# Patient Record
Sex: Female | Born: 1960 | Race: White | Hispanic: No | Smoking: Former smoker
Health system: Southern US, Community
[De-identification: ages and names within clinical notes are randomized; demographics above are authoritative.]

## PROBLEM LIST (undated history)

## (undated) DIAGNOSIS — C50412 Malignant neoplasm of upper-outer quadrant of left female breast: Secondary | ICD-10-CM

## (undated) DIAGNOSIS — F419 Anxiety disorder, unspecified: Secondary | ICD-10-CM

## (undated) DIAGNOSIS — I10 Essential (primary) hypertension: Secondary | ICD-10-CM

## (undated) DIAGNOSIS — J189 Pneumonia, unspecified organism: Secondary | ICD-10-CM

## (undated) DIAGNOSIS — Z923 Personal history of irradiation: Secondary | ICD-10-CM

## (undated) HISTORY — PX: CHOLECYSTECTOMY: SHX55

## (undated) HISTORY — DX: Malignant neoplasm of upper-outer quadrant of left female breast: C50.412

## (undated) HISTORY — PX: UTERINE FIBROID EMBOLIZATION: SHX825

## (undated) HISTORY — PX: TONSILLECTOMY: SUR1361

---

## 1998-01-22 ENCOUNTER — Other Ambulatory Visit: Admission: RE | Admit: 1998-01-22 | Discharge: 1998-01-22 | Payer: Self-pay | Admitting: Obstetrics and Gynecology

## 1999-01-24 ENCOUNTER — Other Ambulatory Visit: Admission: RE | Admit: 1999-01-24 | Discharge: 1999-01-24 | Payer: Self-pay | Admitting: Obstetrics and Gynecology

## 1999-02-11 ENCOUNTER — Other Ambulatory Visit: Admission: RE | Admit: 1999-02-11 | Discharge: 1999-02-11 | Payer: Self-pay | Admitting: *Deleted

## 1999-06-06 ENCOUNTER — Other Ambulatory Visit: Admission: RE | Admit: 1999-06-06 | Discharge: 1999-06-06 | Payer: Self-pay | Admitting: Obstetrics and Gynecology

## 2000-01-31 ENCOUNTER — Other Ambulatory Visit: Admission: RE | Admit: 2000-01-31 | Discharge: 2000-01-31 | Payer: Self-pay | Admitting: Obstetrics and Gynecology

## 2001-03-19 ENCOUNTER — Other Ambulatory Visit: Admission: RE | Admit: 2001-03-19 | Discharge: 2001-03-19 | Payer: Self-pay | Admitting: Obstetrics and Gynecology

## 2001-05-30 ENCOUNTER — Other Ambulatory Visit: Admission: RE | Admit: 2001-05-30 | Discharge: 2001-05-30 | Payer: Self-pay | Admitting: Obstetrics and Gynecology

## 2002-03-25 ENCOUNTER — Other Ambulatory Visit: Admission: RE | Admit: 2002-03-25 | Discharge: 2002-03-25 | Payer: Self-pay | Admitting: Obstetrics and Gynecology

## 2003-04-15 ENCOUNTER — Other Ambulatory Visit: Admission: RE | Admit: 2003-04-15 | Discharge: 2003-04-15 | Payer: Self-pay | Admitting: Obstetrics and Gynecology

## 2004-05-12 ENCOUNTER — Other Ambulatory Visit: Admission: RE | Admit: 2004-05-12 | Discharge: 2004-05-12 | Payer: Self-pay | Admitting: Obstetrics and Gynecology

## 2004-11-28 ENCOUNTER — Other Ambulatory Visit: Admission: RE | Admit: 2004-11-28 | Discharge: 2004-11-28 | Payer: Self-pay | Admitting: Obstetrics and Gynecology

## 2005-08-11 ENCOUNTER — Other Ambulatory Visit: Admission: RE | Admit: 2005-08-11 | Discharge: 2005-08-11 | Payer: Self-pay | Admitting: Obstetrics and Gynecology

## 2006-11-14 ENCOUNTER — Encounter: Admission: RE | Admit: 2006-11-14 | Discharge: 2006-11-14 | Payer: Self-pay | Admitting: Obstetrics and Gynecology

## 2006-11-23 ENCOUNTER — Encounter: Admission: RE | Admit: 2006-11-23 | Discharge: 2006-11-23 | Payer: Self-pay | Admitting: Diagnostic Radiology

## 2007-01-16 ENCOUNTER — Ambulatory Visit (HOSPITAL_COMMUNITY): Admission: RE | Admit: 2007-01-16 | Discharge: 2007-01-16 | Payer: Self-pay | Admitting: Diagnostic Radiology

## 2007-01-17 ENCOUNTER — Observation Stay (HOSPITAL_COMMUNITY): Admission: RE | Admit: 2007-01-17 | Discharge: 2007-01-18 | Payer: Self-pay | Admitting: Diagnostic Radiology

## 2007-01-22 ENCOUNTER — Ambulatory Visit (HOSPITAL_COMMUNITY): Admission: RE | Admit: 2007-01-22 | Discharge: 2007-01-22 | Payer: Self-pay | Admitting: Diagnostic Radiology

## 2007-01-23 ENCOUNTER — Ambulatory Visit (HOSPITAL_COMMUNITY): Admission: RE | Admit: 2007-01-23 | Discharge: 2007-01-23 | Payer: Self-pay | Admitting: Diagnostic Radiology

## 2010-11-21 ENCOUNTER — Encounter: Payer: Self-pay | Admitting: Diagnostic Radiology

## 2012-04-03 ENCOUNTER — Other Ambulatory Visit: Payer: Self-pay | Admitting: Obstetrics and Gynecology

## 2012-04-03 DIAGNOSIS — R928 Other abnormal and inconclusive findings on diagnostic imaging of breast: Secondary | ICD-10-CM

## 2012-04-09 ENCOUNTER — Ambulatory Visit
Admission: RE | Admit: 2012-04-09 | Discharge: 2012-04-09 | Disposition: A | Discharge status: Home / Self Care | Payer: BC Managed Care – PPO | Source: Ambulatory Visit | Attending: Obstetrics and Gynecology | Admitting: Obstetrics and Gynecology

## 2012-04-09 DIAGNOSIS — R928 Other abnormal and inconclusive findings on diagnostic imaging of breast: Secondary | ICD-10-CM

## 2014-07-03 ENCOUNTER — Encounter (HOSPITAL_BASED_OUTPATIENT_CLINIC_OR_DEPARTMENT_OTHER): Payer: Self-pay | Admitting: Emergency Medicine

## 2014-07-03 ENCOUNTER — Emergency Department (HOSPITAL_BASED_OUTPATIENT_CLINIC_OR_DEPARTMENT_OTHER)
Admission: EM | Admit: 2014-07-03 | Discharge: 2014-07-04 | Disposition: A | Discharge status: Other Acute Inpatient Hospital | Payer: BC Managed Care – PPO | Attending: Emergency Medicine | Admitting: Emergency Medicine

## 2014-07-03 ENCOUNTER — Emergency Department (HOSPITAL_BASED_OUTPATIENT_CLINIC_OR_DEPARTMENT_OTHER): Payer: BC Managed Care – PPO

## 2014-07-03 DIAGNOSIS — Z87891 Personal history of nicotine dependence: Secondary | ICD-10-CM | POA: Insufficient documentation

## 2014-07-03 DIAGNOSIS — K819 Cholecystitis, unspecified: Secondary | ICD-10-CM | POA: Diagnosis not present

## 2014-07-03 DIAGNOSIS — R1013 Epigastric pain: Secondary | ICD-10-CM | POA: Insufficient documentation

## 2014-07-03 LAB — URINALYSIS, ROUTINE W REFLEX MICROSCOPIC
BILIRUBIN URINE: NEGATIVE
GLUCOSE, UA: NEGATIVE mg/dL
Hgb urine dipstick: NEGATIVE
KETONES UR: NEGATIVE mg/dL
Leukocytes, UA: NEGATIVE
Nitrite: NEGATIVE
PROTEIN: NEGATIVE mg/dL
SPECIFIC GRAVITY, URINE: 1.008 (ref 1.005–1.030)
UROBILINOGEN UA: 0.2 mg/dL (ref 0.0–1.0)
pH: 7 (ref 5.0–8.0)

## 2014-07-03 LAB — COMPREHENSIVE METABOLIC PANEL WITH GFR
ALT: 16 U/L (ref 0–35)
AST: 16 U/L (ref 0–37)
Albumin: 4.1 g/dL (ref 3.5–5.2)
Alkaline Phosphatase: 119 U/L — ABNORMAL HIGH (ref 39–117)
Anion gap: 14 (ref 5–15)
BUN: 9 mg/dL (ref 6–23)
CO2: 25 meq/L (ref 19–32)
Calcium: 10.2 mg/dL (ref 8.4–10.5)
Chloride: 101 meq/L (ref 96–112)
Creatinine, Ser: 0.7 mg/dL (ref 0.50–1.10)
GFR calc Af Amer: >90 mL/min (ref >90)
GFR calc non Af Amer: >90 mL/min (ref >90)
Glucose, Bld: 113 mg/dL — ABNORMAL HIGH (ref 70–99)
Potassium: 4.7 meq/L (ref 3.7–5.3)
Sodium: 140 meq/L (ref 137–147)
Total Bilirubin: 0.3 mg/dL (ref 0.3–1.2)
Total Protein: 8.1 g/dL (ref 6.0–8.3)

## 2014-07-03 LAB — LIPASE, BLOOD: Lipase: 32 U/L (ref 11–59)

## 2014-07-03 LAB — CBC
HCT: 40.5 % (ref 36.0–46.0)
Hemoglobin: 13.3 g/dL (ref 12.0–15.0)
MCH: 27.7 pg (ref 26.0–34.0)
MCHC: 32.8 g/dL (ref 30.0–36.0)
MCV: 84.2 fL (ref 78.0–100.0)
Platelets: 303 K/uL (ref 150–400)
RBC: 4.81 MIL/uL (ref 3.87–5.11)
RDW: 14.1 % (ref 11.5–15.5)
WBC: 13.8 K/uL — ABNORMAL HIGH (ref 4.0–10.5)

## 2014-07-03 LAB — OCCULT BLOOD X 1 CARD TO LAB, STOOL: Fecal Occult Bld: NEGATIVE

## 2014-07-03 LAB — TROPONIN I: Troponin I: <0.3 ng/mL (ref <0.30)

## 2014-07-03 MED ORDER — PANTOPRAZOLE SODIUM 40 MG IV SOLR
40.0000 mg | Freq: Once | INTRAVENOUS | Status: AC
  Administered 2014-07-03: 40 mg via INTRAVENOUS
  Filled 2014-07-03: qty 40

## 2014-07-03 MED ORDER — FAMOTIDINE IN NACL 20-0.9 MG/50ML-% IV SOLN
20.0000 mg | Freq: Once | INTRAVENOUS | Status: AC
  Administered 2014-07-03: 20 mg via INTRAVENOUS
  Filled 2014-07-03: qty 50

## 2014-07-03 MED ORDER — HYDROMORPHONE HCL PF 1 MG/ML IJ SOLN
1.0000 mg | Freq: Once | INTRAMUSCULAR | Status: AC
  Administered 2014-07-03: 1 mg via INTRAVENOUS
  Filled 2014-07-03: qty 1

## 2014-07-03 MED ORDER — IOHEXOL 300 MG/ML  SOLN
50.0000 mL | Freq: Once | INTRAMUSCULAR | Status: AC | PRN
  Administered 2014-07-03: 50 mL via ORAL

## 2014-07-03 MED ORDER — ONDANSETRON HCL 4 MG/2ML IJ SOLN
4.0000 mg | Freq: Once | INTRAMUSCULAR | Status: AC
  Administered 2014-07-03: 4 mg via INTRAVENOUS
  Filled 2014-07-03: qty 2

## 2014-07-03 MED ORDER — IOHEXOL 300 MG/ML  SOLN
100.0000 mL | Freq: Once | INTRAMUSCULAR | Status: AC | PRN
  Administered 2014-07-03: 100 mL via INTRAVENOUS

## 2014-07-03 NOTE — ED Notes (Signed)
MD at bedside. 

## 2014-07-03 NOTE — ED Notes (Signed)
Pt reports abdominal pain with nausea since Wednesday. Pain located epigastric area, radiating into back. No fever, diarrhea. Decreased appetite.

## 2014-07-03 NOTE — ED Provider Notes (Signed)
CSN: [id number]     Arrival date & time 07/03/14  1752 History  This chart was scribed for No att. providers found by Donato Schultz, ED Scribe. This patient was seen in room MHOTF/OTF and the patient's care was started at 8:17 PM.     Chief Complaint  Patient presents with  . Abdominal Pain    HPI HPI Comments: Norma Robles is a 53 y.o. female who presents to the Emergency Department complaining of gradually worsening, constant epigastric abdominal pain radiating to her upper back that started 48 hours ago with gradual onset.  Advil provided mild relief to her symptoms.  She lists nausea and decreased appetite as associated symptoms.  She denies vomiting, cough, fever, chest pain, dysuria, vaginal discharge, vaginal bleeding, hematochezia, diarrhea, and melena as associated symptoms.  She has never experienced these symptoms in the past.  She is general very healthy.  She does not have a history of abdominal surgeries.  She has a family history of gallbladder problems.     History reviewed. No pertinent past medical history. Past Surgical History  Procedure Laterality Date  . Tonsillectomy     No family history on file. History  Substance Use Topics  . Smoking status: Former Research scientist (life sciences)  . Smokeless tobacco: Not on file  . Alcohol Use: No   OB History   Grav Para Term Preterm Abortions TAB SAB Ect Mult Living                 Review of Systems 10 Systems reviewed and are negative for acute change except as noted in the HPI.   Allergies  Codeine  Home Medications   Prior to Admission medications   Not on File   BP 115/67  Pulse 83  Temp(Src) 98 F (36.7 C) (Oral)  Resp 16  Ht 5\' 4"  (1.626 m)  Wt 147 lb (66.679 kg)  BMI 25.22 kg/m2  SpO2 99% Physical Exam  Nursing note and vitals reviewed. Constitutional:  Awake, alert, nontoxic appearance.  HENT:  Head: Atraumatic.  Eyes: Right eye exhibits no discharge. Left eye exhibits no discharge.  Neck: Neck supple.   Cardiovascular: Normal rate and regular rhythm.   No murmur heard. Pulmonary/Chest: Effort normal and breath sounds normal. No respiratory distress. She has no wheezes. She has no rales. She exhibits no tenderness.  Pulse ox normal.  Abdominal: Soft. There is tenderness. There is no rebound.  Bowel sounds present.  Moderate tenderness to epigastrium and right upper quadrant with mild tenderness to the remainder of the abdomen.    Musculoskeletal: She exhibits no tenderness.  Baseline ROM, no obvious new focal weakness.  Neurological:  Mental status and motor strength appears baseline for patient and situation.  Skin: No rash noted.  Psychiatric: She has a normal mood and affect.    ED Course  Procedures (including critical care time) Pt stable in ED with no significant deterioration in condition.Patient / Family / Caregiver informed of clinical course, understand medical decision-making process, and agree with plan. Pt's PCP is with [facility] at [place], d/w [facility] S[facility] at [place] will consult in AM, [place] [facility] hospitalist paged for transfer. Recheck Pt still with moderate pain and RUQ tenderness. D/w Dr. Maudie Mercury accepts for transfer. E[place] Reviewed  CBC - Abnormal; Notable for the following:    WBC 13.8 (*)    All other components within normal limits  COMPREHENSIVE METABOLIC PANEL - Abnormal; Notable for the following:  Glucose, Bld 113 (*)    Alkaline Phosphatase 119 (*)    All other components within normal limits  URINALYSIS, ROUTINE W REFLEX MICROSCOPIC  LIPASE, BLOOD  TROPONIN I  OCCULT BLOOD X 1 CARD TO LAB, STOOL  POC OCCULT BLOOD, ED    Imaging Review Ct Abdomen Pelvis W Contrast  07/03/2014   CLINICAL DATA:  Epigastric and right upper quadrant abdominal pain. Elevated white blood cell count. History of fibroid embolization.  EXAM: CT ABDOMEN AND PELVIS WITH CONTRAST  TECHNIQUE: Multidetector CT imaging of the abdomen and pelvis was performed using the  standard protocol following bolus administration of intravenous contrast.  CONTRAST:  52mL OMNIPAQUE IOHEXOL 300 MG/ML SOLN, 158mL OMNIPAQUE IOHEXOL 300 MG/ML SOLN  COMPARISON:  MRI of the pelvis on 11/23/2006.  FINDINGS: There are several calcified gallstones in a distended gallbladder. Inflammatory changes are seen around the gallbladder and findings are suggestive of cholecystitis. No associated biliary ductal dilatation. The liver shows a small cyst adjacent to the gallbladder fossa measuring 10 mm.  The pancreas, spleen, adrenal glands and kidneys are within normal limits. Degenerated fibroids are present with overall decreased uterine volume compared to prior MRI with changes reflecting post embolization changes. The bladder is unremarkable. No abnormal fluid collections. Bowel loops are within normal limits. No hernias. No masses or enlarged lymph nodes. Bony structures are unremarkable.  IMPRESSION: Cholelithiasis. The gallbladder is distended and inflammatory changes are seen around the gallbladder suggestive of acute cholecystitis. No focal abscess is identified.   Electronically Signed   By: Aletta Edouard M.D.   On: 07/03/2014 22:06     EKG Interpretation None      MDM   Final diagnoses:  Cholecystitis    Surg recs no antibiotics for now.  The patient appears reasonably stabilized for transfer considering the current resources, flow, and capabilities available in the ED at this time, and I doubt any other [facility] requiring further screening and/or treatment in the ED prior to transfer.  I personally performed the services described in this documentation, which was scribed in my presence. The recorded information has been reviewed and is accurate.    Babette Relic, MD 07/04/14 [phone]

## 2014-07-03 NOTE — ED Notes (Signed)
Family at bedside. 

## 2014-07-03 NOTE — ED Notes (Signed)
Patient transported to CT 

## 2014-07-04 DIAGNOSIS — Z87891 Personal history of nicotine dependence: Secondary | ICD-10-CM | POA: Diagnosis not present

## 2014-07-04 DIAGNOSIS — K819 Cholecystitis, unspecified: Secondary | ICD-10-CM | POA: Diagnosis not present

## 2014-07-04 DIAGNOSIS — R1013 Epigastric pain: Secondary | ICD-10-CM | POA: Diagnosis present

## 2014-10-30 DIAGNOSIS — C50919 Malignant neoplasm of unspecified site of unspecified female breast: Secondary | ICD-10-CM

## 2014-10-30 DIAGNOSIS — Z923 Personal history of irradiation: Secondary | ICD-10-CM

## 2014-10-30 HISTORY — DX: Malignant neoplasm of unspecified site of unspecified female breast: C50.919

## 2014-10-30 HISTORY — PX: CERVICAL POLYPECTOMY: SHX88

## 2014-10-30 HISTORY — DX: Personal history of irradiation: Z92.3

## 2015-02-10 ENCOUNTER — Ambulatory Visit (INDEPENDENT_AMBULATORY_CARE_PROVIDER_SITE_OTHER): Payer: BLUE CROSS/BLUE SHIELD | Admitting: Podiatry

## 2015-02-10 ENCOUNTER — Encounter: Payer: Self-pay | Admitting: Podiatry

## 2015-02-10 VITALS — BP 146/75 | HR 70 | Ht 60.0 in | Wt 154.0 lb

## 2015-02-10 DIAGNOSIS — M7662 Achilles tendinitis, left leg: Secondary | ICD-10-CM | POA: Diagnosis not present

## 2015-02-10 DIAGNOSIS — M7661 Achilles tendinitis, right leg: Secondary | ICD-10-CM | POA: Diagnosis not present

## 2015-02-10 DIAGNOSIS — M216X9 Other acquired deformities of unspecified foot: Secondary | ICD-10-CM | POA: Diagnosis not present

## 2015-02-10 MED ORDER — DICLOFENAC SODIUM 1 % TD GEL: 2.0000 g | Freq: Three times a day (TID) | TRANSDERMAL | Status: DC

## 2015-02-10 NOTE — Patient Instructions (Signed)
Seen for pain on back of both heels.  Reviewed findings. Need to do stretch exercise, wear sandal with heel at home, and use Night splint each night. Return for custom orthotics.

## 2015-02-10 NOTE — Progress Notes (Signed)
Subjective: 54 year old female presents complaining of heel pain over 6 months. Patient points back of Achilles tendon insertion site being the source of pain. Used Dr. Felicie Morn pad and exercised without much success. Pain has been off and on. Now pain is staying and hurts daily.  Sits on desk at work. Work place has concrete and house is on slab. Wears different styles of shoes.   Review of systems: Negative.  Objective:  Neurovascular status are within normal.  Pain at posterior heel at tendon insertion site. No visible edema or erythema noted. Cavus type foot bilateral. Tight Achilles tendon bilateral.  Assessment: Achilles tendonitis bilateral. Equinus bilateral.   Plan:  Reviewed findings and available treatment options. Night splint dispensed x 2. Voltaren gel Rx sent. Need to do stretch exercise.  Avoid flat shoes and keep house shoe with heel lift.

## 2015-02-19 ENCOUNTER — Ambulatory Visit (INDEPENDENT_AMBULATORY_CARE_PROVIDER_SITE_OTHER): Payer: BLUE CROSS/BLUE SHIELD | Admitting: Podiatry

## 2015-02-19 ENCOUNTER — Encounter: Payer: Self-pay | Admitting: Podiatry

## 2015-02-19 DIAGNOSIS — M7660 Achilles tendinitis, unspecified leg: Secondary | ICD-10-CM

## 2015-02-19 DIAGNOSIS — M7661 Achilles tendinitis, right leg: Secondary | ICD-10-CM

## 2015-02-19 DIAGNOSIS — M216X9 Other acquired deformities of unspecified foot: Secondary | ICD-10-CM | POA: Diagnosis not present

## 2015-02-19 DIAGNOSIS — M7662 Achilles tendinitis, left leg: Principal | ICD-10-CM

## 2015-02-19 DIAGNOSIS — M79673 Pain in unspecified foot: Secondary | ICD-10-CM | POA: Diagnosis not present

## 2015-02-19 NOTE — Patient Instructions (Signed)
Casted for orthotics to fit in dress shoes.

## 2015-02-19 NOTE — Progress Notes (Signed)
Subjective: 54 year old female presents to prepare for custom orthotics. She usually wears dress shoes and wants the orthotics to be fit for them.  She is using night splints that are helping.   Objective:  Neurovascular status are within normal.  Pain at posterior heel at tendon insertion site. No visible edema or erythema noted. Cavus type foot bilateral. Tight Achilles tendon bilateral. 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 No acute osseous or articulation changes noted.   Assessment: Achilles tendonitis bilateral. Equinus bilateral.   Plan:  Reviewed findings and available treatment options. Both feet casted for orthotics.

## 2015-04-23 ENCOUNTER — Ambulatory Visit: Payer: BLUE CROSS/BLUE SHIELD | Admitting: Podiatry

## 2015-05-27 ENCOUNTER — Other Ambulatory Visit: Payer: Self-pay | Admitting: Obstetrics and Gynecology

## 2015-05-27 DIAGNOSIS — R928 Other abnormal and inconclusive findings on diagnostic imaging of breast: Secondary | ICD-10-CM

## 2015-06-02 ENCOUNTER — Ambulatory Visit
Admission: RE | Admit: 2015-06-02 | Discharge: 2015-06-02 | Disposition: A | Discharge status: Home / Self Care | Payer: BLUE CROSS/BLUE SHIELD | Source: Ambulatory Visit | Attending: Obstetrics and Gynecology | Admitting: Obstetrics and Gynecology

## 2015-06-02 ENCOUNTER — Other Ambulatory Visit: Payer: Self-pay | Admitting: Obstetrics and Gynecology

## 2015-06-02 DIAGNOSIS — R928 Other abnormal and inconclusive findings on diagnostic imaging of breast: Secondary | ICD-10-CM

## 2015-06-02 DIAGNOSIS — N6320 Unspecified lump in the left breast, unspecified quadrant: Secondary | ICD-10-CM

## 2015-06-02 HISTORY — PX: BREAST BIOPSY: SHX20

## 2015-06-04 ENCOUNTER — Telehealth: Payer: Self-pay | Admitting: *Deleted

## 2015-06-04 ENCOUNTER — Encounter: Payer: Self-pay | Admitting: *Deleted

## 2015-06-04 DIAGNOSIS — C50412 Malignant neoplasm of upper-outer quadrant of left female breast: Secondary | ICD-10-CM

## 2015-06-04 HISTORY — DX: Malignant neoplasm of upper-outer quadrant of left female breast: C50.412

## 2015-06-04 NOTE — Telephone Encounter (Signed)
Confirmed [facility] for 06/09/15 at 1200 .  Instructions and contact information given.

## 2015-06-09 ENCOUNTER — Encounter: Payer: Self-pay | Admitting: Adult Health

## 2015-06-09 ENCOUNTER — Encounter: Payer: Self-pay | Admitting: General Practice

## 2015-06-09 ENCOUNTER — Encounter: Payer: Self-pay | Admitting: Hematology and Oncology

## 2015-06-09 ENCOUNTER — Encounter: Payer: Self-pay | Admitting: Physical Therapy

## 2015-06-09 ENCOUNTER — Ambulatory Visit (HOSPITAL_BASED_OUTPATIENT_CLINIC_OR_DEPARTMENT_OTHER): Payer: BLUE CROSS/BLUE SHIELD | Admitting: Hematology and Oncology

## 2015-06-09 ENCOUNTER — Ambulatory Visit: Payer: BLUE CROSS/BLUE SHIELD

## 2015-06-09 ENCOUNTER — Other Ambulatory Visit: Payer: Self-pay | Admitting: General Surgery

## 2015-06-09 ENCOUNTER — Ambulatory Visit: Payer: BLUE CROSS/BLUE SHIELD | Attending: General Surgery | Admitting: Physical Therapy

## 2015-06-09 ENCOUNTER — Ambulatory Visit
Admission: RE | Admit: 2015-06-09 | Discharge: 2015-06-09 | Disposition: A | Discharge status: Home / Self Care | Payer: BLUE CROSS/BLUE SHIELD | Source: Ambulatory Visit | Attending: Radiation Oncology | Admitting: Radiation Oncology

## 2015-06-09 ENCOUNTER — Other Ambulatory Visit (HOSPITAL_BASED_OUTPATIENT_CLINIC_OR_DEPARTMENT_OTHER): Payer: BLUE CROSS/BLUE SHIELD

## 2015-06-09 VITALS — BP 135/64 | HR 77 | Temp 98.1°F | Resp 18 | Ht 60.0 in | Wt 149.1 lb

## 2015-06-09 DIAGNOSIS — C50912 Malignant neoplasm of unspecified site of left female breast: Secondary | ICD-10-CM

## 2015-06-09 DIAGNOSIS — Z170 Estrogen receptor positive status [ER+]: Secondary | ICD-10-CM

## 2015-06-09 DIAGNOSIS — C50412 Malignant neoplasm of upper-outer quadrant of left female breast: Secondary | ICD-10-CM | POA: Insufficient documentation

## 2015-06-09 DIAGNOSIS — R293 Abnormal posture: Secondary | ICD-10-CM | POA: Diagnosis present

## 2015-06-09 LAB — CBC WITH DIFFERENTIAL/PLATELET
BASO%: 1 % (ref 0.0–2.0)
BASOS ABS: 0.1 10*3/uL (ref 0.0–0.1)
EOS%: 1.6 % (ref 0.0–7.0)
Eosinophils Absolute: 0.1 10*3/uL (ref 0.0–0.5)
HEMATOCRIT: 35.1 % (ref 34.8–46.6)
HEMOGLOBIN: 11.2 g/dL — AB (ref 11.6–15.9)
LYMPH%: 27.8 % (ref 14.0–49.7)
MCH: 23.9 pg — ABNORMAL LOW (ref 25.1–34.0)
MCHC: 32 g/dL (ref 31.5–36.0)
MCV: 74.5 fL — AB (ref 79.5–101.0)
MONO#: 0.5 10*3/uL (ref 0.1–0.9)
MONO%: 6.2 % (ref 0.0–14.0)
NEUT#: 5.1 10*3/uL (ref 1.5–6.5)
NEUT%: 63.4 % (ref 38.4–76.8)
Platelets: 334 10*3/uL (ref 145–400)
RBC: 4.7 10*6/uL (ref 3.70–5.45)
RDW: 15.8 % — ABNORMAL HIGH (ref 11.2–14.5)
WBC: 8 10*3/uL (ref 3.9–10.3)
lymph#: 2.2 10*3/uL (ref 0.9–3.3)

## 2015-06-09 LAB — COMPREHENSIVE METABOLIC PANEL (CC13)
ALK PHOS: 127 U/L (ref 40–150)
ALT: 23 U/L (ref 0–55)
ANION GAP: 9 meq/L (ref 3–11)
AST: 17 U/L (ref 5–34)
Albumin: 4.4 g/dL (ref 3.5–5.0)
BILIRUBIN TOTAL: 0.28 mg/dL (ref 0.20–1.20)
BUN: 12.9 mg/dL (ref 7.0–26.0)
CALCIUM: 10.3 mg/dL (ref 8.4–10.4)
CO2: 27 mEq/L (ref 22–29)
Chloride: 107 mEq/L (ref 98–109)
Creatinine: 0.8 mg/dL (ref 0.6–1.1)
EGFR: 84 mL/min/{1.73_m2} — ABNORMAL LOW (ref >90)
Glucose: 97 mg/dl (ref 70–140)
POTASSIUM: 4.3 meq/L (ref 3.5–5.1)
SODIUM: 142 meq/L (ref 136–145)
Total Protein: 7.6 g/dL (ref 6.4–8.3)

## 2015-06-09 NOTE — Therapy (Signed)
[place], [place], [address] Phone: [phone]   Fax:  [phone]  Physical Therapy Evaluation  Patient Details  Name: Norma Robles MRN: [id number] Date of Birth: [birth year] Referring Provider:  Excell Seltzer, MD  Encounter Date: 06/09/2015      PT End of Session - 06/09/15 1518    Visit Number 1   Number of Visits 1   PT Start Time 3244   PT Stop Time 0102  Also saw pt from 1406-1419   PT Time Calculation (min) 17 min   Activity Tolerance Patient tolerated treatment well   Behavior During Therapy [facility] for tasks assessed/performed      Past Medical History  Diagnosis Date  . Breast cancer of upper-outer quadrant of left female breast 06/04/2015    Past Surgical History  Procedure Laterality Date  . Tonsillectomy    . Cholecystectomy    . Uterine fibroid embolization      There were no vitals filed for this visit.  Visit Diagnosis:  Carcinoma of upper-outer quadrant of left female breast - Plan: PT plan of care cert/re-cert  Abnormal posture - Plan: PT plan of care cert/re-cert      Subjective Assessment - 06/09/15 1524    Pertinent History Patient was diagnosed on 05/24/15 with left breast cancer.  It is ER/PR positive and HER2 negative.  It is located in the upper outer quadrant, measures 1.6 cm, with a Ki67 of 30%.            [facility] PT Assessment - 06/09/15 0001    Assessment   Medical Diagnosis Left breast cancer   Onset Date/Surgical Date 05/24/15   Hand Dominance Right   Prior Therapy none   Precautions   Precautions Other (comment)  Active breast cancer   Restrictions   Weight Bearing Restrictions No   Balance Screen   Has the patient fallen in the past 6 months No   Has the patient had a decrease in activity level because of a fear of falling?  No   Is the patient reluctant to leave their home because of a fear of falling?  No   Home Chiropractor Private residence   Living Arrangements Alone   Available Help at Discharge Family   Prior Function   Level of Independence Independent   Vocation Full time employment   Engineer, site on computer full time   Leisure She does not exercise   Cognition   Overall Cognitive Status Within Functional Limits for tasks assessed   Posture/Postural Control   Posture/Postural Control Postural limitations   Postural Limitations Rounded Shoulders   ROM / Strength   AROM / PROM / Strength AROM;Strength   AROM   AROM Assessment Site Shoulder   Right/Left Shoulder Right;Left   Right Shoulder Extension 56 Degrees   Right Shoulder Flexion 153 Degrees   Right Shoulder ABduction 160 Degrees   Right Shoulder Internal Rotation 54 Degrees   Right Shoulder External Rotation 79 Degrees   Left Shoulder Extension 54 Degrees   Left Shoulder Flexion 159 Degrees   Left Shoulder ABduction 164 Degrees   Left Shoulder Internal Rotation 67 Degrees   Left Shoulder External Rotation 74 Degrees   Strength   Overall Strength Within functional limits for tasks performed           LYMPHEDEMA/ONCOLOGY QUESTIONNAIRE - 06/09/15 1439    Type   Cancer Type Left breast cancer   Lymphedema  Assessments   Lymphedema Assessments Upper extremities   Right Upper Extremity Lymphedema   10 cm Proximal to Olecranon Process 26.9 cm   Olecranon Process 23 cm   10 cm Proximal to Ulnar Styloid Process 22.8 cm   Just Proximal to Ulnar Styloid Process 16.1 cm   Across Hand at [employer] 18.6 cm   At [place] of 2nd Digit 6.1 cm   Left Upper Extremity Lymphedema   10 cm Proximal to Olecranon Process 26 cm   Olecranon Process 23.5 cm   10 cm Proximal to Ulnar Styloid Process 22.3 cm   Just Proximal to Ulnar Styloid Process 15.2 cm   Across Hand at [employer] 17.6 cm   At [place] of 2nd Digit 6 cm      Patient was instructed today in a home exercise program today for post op  shoulder range of motion. These included active assist shoulder flexion in sitting, scapular retraction, wall walking with shoulder abduction, and hands behind head external rotation.  She was encouraged to do these twice a day, holding 3 seconds and repeating 5 times when permitted by her physician.         PT Education - 06/09/15 1518    Education provided Yes   Education Details Post op shoulder ROM HEP and lymphedema risk reduction   Person(s) Educated Patient;Child(ren)   Methods Explanation;Demonstration;Handout   Comprehension Verbalized understanding;Returned demonstration              Breast Clinic Goals - 06/09/15 1524    Patient will be able to verbalize understanding of pertinent lymphedema risk reduction practices relevant to her diagnosis specifically related to skin care.   Time 1   Period Days   Status Achieved   Patient will be able to return demonstrate and/or verbalize understanding of the post-op home exercise program related to regaining shoulder range of motion.   Time 1   Period Days   Status Achieved   Patient will be able to verbalize understanding of the importance of attending the postoperative After Breast Cancer Class for further lymphedema risk reduction education and therapeutic exercise.   Time 1   Period Days   Status Achieved              Plan - 06/09/15 1521    Clinical Impression Statement Patient was diagnosed on 05/24/15 with left breast cancer.  It is ER/PR positive and HER2 negative.  It is located in the upper outer quadrant, measures 1.6 cm, with a Ki67 of 30%.  She is planning to have a left lumpectomy and sentinel node biopsy followed by Oncotype testing and radiation and anti-estrogen therapy.  She may benefit from post op PT to regain shoulder ROM and strength and reduce her lymphedema risk.   Pt will benefit from skilled therapeutic intervention in order to improve on the following deficits Decreased range of motion;Impaired  UE functional use;Pain;Decreased knowledge of precautions;Decreased strength   Rehab Potential Excellent   Clinical Impairments Affecting Rehab Potential none   PT Frequency One time visit   PT Treatment/Interventions Patient/family education;Therapeutic exercise   Consulted and Agree with Plan of Care Patient;Family member/caregiver   Family Member Consulted daughters     Patient will follow up at outpatient cancer rehab if needed following surgery.  If the patient requires physical therapy at that time, a specific plan will be dictated and sent to the referring physician for approval. The patient was educated today on appropriate basic range of motion  exercises to begin post operatively and the importance of attending the After Breast Cancer class following surgery.  Patient was educated today on lymphedema risk reduction practices as it pertains to recommendations that will benefit the patient immediately following surgery.  She verbalized good understanding.  No additional physical therapy is indicated at this time.       Problem List Patient Active Problem List   Diagnosis Date Noted  . Breast cancer of upper-outer quadrant of left female breast 06/04/2015  . Achilles tendonitis, bilateral 02/10/2015  . Equinus deformity of foot, acquired 02/10/2015   Annia Friendly, PT 06/09/2015 3:26 PM  [place], [place], [address] Phone: [phone]   Fax:  [phone]

## 2015-06-09 NOTE — Progress Notes (Signed)
Ms. Hunsberger is a very pleasant 54 y.o. 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  Biopsy results revealed the tumor's prognostic profile is ER positive and PR positive. HER2/neu is pending.   She presents today with her 2 daughters to the [facility]) for treatment consideration and recommendations from the breast surgeon, radiation oncologist, and medical oncologist.     I briefly met with Ms. Steedman and her daughters during her [facility] visit today. We discussed the purpose of the Survivorship Clinic, which will include monitoring for recurrence, coordinating completion of age and gender-appropriate cancer screenings, promotion of overall wellness, as well as managing potential late/long-term side effects of anti-cancer treatments.    The treatment plan for Ms. Connors will likely include surgery, radiation therapy, and anti-estrogen therapy.  As of today, the intent of treatment for Ms. Coldren is cure, therefore she will be eligible for the Survivorship Clinic upon her completion of treatment.  Her survivorship care plan (SCP) document will be drafted and updated throughout the course of her treatment trajectory. She will receive the SCP in an office visit with our breast survivorship NP in the Survivorship Clinic once she has completed treatment.   Ms. Scarbrough was encouraged to ask questions and all questions were answered to her satisfaction.  She was given my business card and encouraged to contact me with any concerns regarding survivorship.  We look forward to participating in her care.   Mike Craze, NP [place] [phone]

## 2015-06-09 NOTE — Patient Instructions (Signed)

## 2015-06-09 NOTE — Progress Notes (Signed)
[facility] NOTE  Patient Care Team: Rafaela Arville Care, MD as PCP - General (Family Medicine) Molli Posey, MD as Consulting Physician (Obstetrics and Gynecology) Excell Seltzer, MD as Consulting Physician (General Surgery) Nicholas Lose, MD as Consulting Physician (Hematology and Oncology) Eppie Gibson, MD as Attending Physician (Radiation Oncology) Mauro Kaufmann, RN as Registered Nurse Rockwell Germany, RN as Registered Nurse Holley Bouche, NP as Nurse Practitioner (Nurse Practitioner) Sylvan Cheese, NP as Nurse Practitioner (Nurse Practitioner)  CHIEF COMPLAINTS/PURPOSE OF CONSULTATION:  Newly diagnosed breast cancer  HISTORY OF PRESENTING ILLNESS:  Norma Robles 54 y.o. female is here because of recent diagnosis of left breast cancer. She had a screening mammogram which revealed left breast asymmetry that led to further investigation with an ultrasound. There were 3 lesions identified by ultrasound. One was a complex cyst at 11:00 position. At 6:00 position an additional area which was felt to be stable. 11:30 position in upper-outer quadrant of the left breast by ultrasound at 1.6 x 0.9 x 1.4 cm lesion was identified. 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 She does not have any complaints of pain and discomfort skin changes nipple discharge or any other issues with the breasts.   I reviewed her records extensively and collaborated the history with the patient.  SUMMARY OF ONCOLOGIC HISTORY:   Breast cancer of upper-outer quadrant of left female breast   06/02/2015 Initial Diagnosis Left breast biopsy 1:30 position: Invasive ductal carcinoma with associated microcalcification, grade 1-2 ER 100%, PR 60%, HER-2 negative, Ki-67 30%, 1.6 cm tumor T1 cN0 M0 stage IA clinical stage    In terms of breast cancer risk profile:  She menarched  at early age of 44 and went to menopause at age 9 but most recently she had a period  She had 2 pregnancies, her first child was born at [facility] received birth control pills for approx years She was never exposed to fertility medications or hormone replacement therapy.  She has no family history of Breast/GYN/GI cancer  MEDICAL HISTORY:  Past Medical History  Diagnosis Date  . Breast cancer of upper-outer quadrant of left female breast 06/04/2015    SURGICAL HISTORY: Past Surgical History  Procedure Laterality Date  . Tonsillectomy    . Cholecystectomy    . Uterine fibroid embolization      SOCIAL HISTORY: Social History   Social History  . Marital Status: Single    Spouse Name: N/A  . Number of Children: N/A  . Years of Education: N/A   Occupational History  . Not on file.   Social History Main Topics  . Smoking status: Never Smoker   . Smokeless tobacco: Never Used  . Alcohol Use: 0.0 oz/week    0 Standard drinks or equivalent per week     Comment: occas  . Drug Use: No  . Sexual Activity: Not on file   Other Topics Concern  . Not on file   Social History Narrative    FAMILY HISTORY: Family History  Problem Relation Age of Onset  . Uterine cancer Mother   . Breast cancer Maternal Aunt     ALLERGIES:  is allergic to codeine.  MEDICATIONS:  Current Outpatient Prescriptions  Medication Sig Dispense Refill  . hydrochlorothiazide (MICROZIDE) 12.5 MG capsule as needed.   0  . Sennosides (EQ LAXATIVE PO) Take by mouth  as needed.    Marland Kitchen buPROPion (WELLBUTRIN XL) 150 MG 24 hr tablet Take 150 mg by mouth.     No current facility-administered medications for this visit.    REVIEW OF SYSTEMS:   Constitutional: Denies fevers, chills or abnormal night sweats Eyes: Denies blurriness of vision, double vision or watery eyes Ears, nose, mouth, throat, and face: Denies mucositis or sore throat Respiratory: Denies cough, dyspnea or wheezes Cardiovascular:  Denies palpitation, chest discomfort or lower extremity swelling Gastrointestinal:  Denies nausea, heartburn or change in bowel habits Skin: Denies abnormal skin rashes Lymphatics: Denies new lymphadenopathy or easy bruising Neurological:Denies numbness, tingling or new weaknesses Behavioral/Psych: Mood is stable, no new changes  Breast:  Denies any palpable lumps or discharge All other systems were reviewed with the patient and are negative.  PHYSICAL EXAMINATION: ECOG PERFORMANCE STATUS: 1 - Symptomatic but completely ambulatory  Filed Vitals:   06/09/15 1238  BP: 135/64  Pulse: 77  Temp: 98.1 F (36.7 C)  Resp: 18   Filed Weights   06/09/15 1238  Weight: 149 lb 1.6 oz (67.631 kg)    GENERAL:alert, no distress and comfortable SKIN: skin color, texture, turgor are normal, no rashes or significant lesions EYES: normal, conjunctiva are pink and non-injected, sclera clear OROPHARYNX:no exudate, no erythema and lips, buccal mucosa, and tongue normal  NECK: supple, thyroid normal size, non-tender, without nodularity LYMPH:  no palpable lymphadenopathy in the cervical, axillary or inguinal LUNGS: clear to auscultation and percussion with normal breathing effort HEART: regular rate & rhythm and no murmurs and no lower extremity edema ABDOMEN:abdomen soft, non-tender and normal bowel sounds Musculoskeletal:no cyanosis of digits and no clubbing  PSYCH: alert & oriented x 3 with fluent speech NEURO: no focal motor/sensory deficits BREAST: No palpable nodules in breast. No palpable axillary or supraclavicular lymphadenopathy (exam performed in the presence of a chaperone)   LABORATORY DATA:  I have reviewed the data as listed Lab Results  Component Value Date   WBC 8.0 06/09/2015   HGB 11.2* 06/09/2015   HCT 35.1 06/09/2015   MCV 74.5* 06/09/2015   PLT 334 06/09/2015   Lab Results  Component Value Date   NA 142 06/09/2015   K 4.3 06/09/2015   CL 101 07/03/2014   CO2 27  06/09/2015    RADIOGRAPHIC STUDIES: I have personally reviewed the radiological reports and agreed with the findings in the report. Results are summarized as above  ASSESSMENT AND PLAN:  Breast cancer of upper-outer quadrant of left female breast Right breast biopsy 06/02/2015: Invasive ductal carcinoma, grade 2, ER 100%, PR 0%, HER-2 negative, Ki-67 10%; by ultrasound measures 1.6 cm T1 cN0 M0 stage IA clinical stage  Pathology and radiology counseling:Discussed with the patient, the details of pathology including the type of breast cancer,the clinical staging, the significance of ER, PR and HER-2/neu receptors and the implications for treatment. After reviewing the pathology in detail, we proceeded to discuss the different treatment options between surgery, radiation, chemotherapy, antiestrogen therapies.  Recommendations: 1. Breast conserving surgery followed by 2. Oncotype DX testing to determine if chemotherapy would be of any benefit followed by 3. Adjuvant radiation therapy followed by 4. Adjuvant antiestrogen therapy  Oncotype counseling: I discussed Oncotype DX test. I explained to the patient that this is a 21 gene panel to evaluate patient tumors DNA to calculate recurrence score. This would help determine whether patient has high risk or intermediate risk or low risk breast cancer. She understands that if her tumor was found  to be high risk, she would benefit from systemic chemotherapy. If low risk, no need of chemotherapy. If she was found to be intermediate risk, we would need to evaluate the score as well as other risk factors and determine if an abbreviated chemotherapy may be of benefit.  Return to clinic after surgery to discuss final pathology report and then determine if Oncotype DX testing will need to be sent.     All questions were answered. The patient knows to call the clinic with any problems, questions or concerns.    Rulon Eisenmenger, MD 3:54 PM

## 2015-06-09 NOTE — Assessment & Plan Note (Signed)
Right breast biopsy 06/02/2015: Invasive ductal carcinoma, grade 2, ER 100%, PR 0%, HER-2 negative, Ki-67 10%; by ultrasound measures 1.6 cm T1 cN0 M0 stage IA clinical stage  Pathology and radiology counseling:Discussed with the patient, the details of pathology including the type of breast cancer,the clinical staging, the significance of ER, PR and HER-2/neu receptors and the implications for treatment. After reviewing the pathology in detail, we proceeded to discuss the different treatment options between surgery, radiation, chemotherapy, antiestrogen therapies.  Recommendations: 1. Breast conserving surgery followed by 2. Oncotype DX testing to determine if chemotherapy would be of any benefit followed by 3. Adjuvant radiation therapy followed by 4. Adjuvant antiestrogen therapy  Oncotype counseling: I discussed Oncotype DX test. I explained to the patient that this is a 21 gene panel to evaluate patient tumors DNA to calculate recurrence score. This would help determine whether patient has high risk or intermediate risk or low risk breast cancer. She understands that if her tumor was found to be high risk, she would benefit from systemic chemotherapy. If low risk, no need of chemotherapy. If she was found to be intermediate risk, we would need to evaluate the score as well as other risk factors and determine if an abbreviated chemotherapy may be of benefit.  Return to clinic after surgery to discuss final pathology report and then determine if Oncotype DX testing will need to be sent.

## 2015-06-09 NOTE — Progress Notes (Signed)
Checked in new pt with no financial concerns prior to seeing the dr.  Informed pt if chemo is part of her treatment we will contact her ins to see if Josem Kaufmann is required and will obtain it if it is as well as contact foundations that offer copay assistance if needed.  She has my card for any billing questions or concerns.

## 2015-06-09 NOTE — Progress Notes (Signed)
Radiation Oncology         [phone] ________________________________  Initial Outpatient Consultation  Name: Norma Robles MRN: [id number]  Date: 06/09/2015  DOB: 1961/04/23  UL:AGTXMI,WOEHOZY M., MD  Excell Seltzer, MD   REFERRING PHYSICIAN: Excell Seltzer, MD  DIAGNOSIS:    ICD-9-CM ICD-10-CM   1. 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   HISTORY OF PRESENT ILLNESS:Norma Robles is a 54 y.o. female who presented with possible left breast asymmetries on screening mammogram; therefore, on 06/02/2015 she underwent left breast diagnostic mammogram with tomosynthesis. On mammogram this showed three areas of asymmetry in the left breast. Ultrasound was performed. Ultrasound showed a 1.6cm hypoechoic mass at the 1:30 o'clock position. Ultrasound also showed a 1.0cm mass at 11:00 o'clock, favored to be a complicated cyst.  An area at 6:00 was stable. The left axilla was unremarkable. Biopsy on 06/02/2015 of the 1:30 o'clock left breast mass revealied invasive ductal carcinoma with characteristics as above in the diagnosis.  She will undergo D+C and repeat pap for a bleeding uterine polyp and prior abnormal pap smear on Sept 8th. Otherwise she is in her USOH  PREVIOUS RADIATION THERAPY: No  PAST MEDICAL HISTORY:  has a past medical history of Breast cancer of upper-outer quadrant of left female breast (06/04/2015).    PAST SURGICAL HISTORY: Past Surgical History  Procedure Laterality Date  . Tonsillectomy    . Cholecystectomy    . Uterine fibroid embolization      FAMILY HISTORY: family history includes Breast cancer in her maternal aunt; Uterine cancer in her mother.  SOCIAL HISTORY:  reports that she has never smoked. She has never used smokeless tobacco. She reports  that she drinks alcohol. She reports that she does not use illicit drugs.  ALLERGIES: Codeine  MEDICATIONS:  Current Outpatient Prescriptions  Medication Sig Dispense Refill  . buPROPion (WELLBUTRIN XL) 150 MG 24 hr tablet Take 150 mg by mouth.    . hydrochlorothiazide (MICROZIDE) 12.5 MG capsule as needed.   0  . Sennosides (EQ LAXATIVE PO) Take by mouth as needed.     No current facility-administered medications for this encounter.    REVIEW OF SYSTEMS:  Notable for that above.   PHYSICAL EXAM:   Vitals with Age-Percentiles 06/09/2015  Length 248.2 cm  Systolic 500  Diastolic 64  Pulse 77  Respiration 18  Weight 67.631 kg  BMI 29.2  VISIT REPORT    General: Alert and oriented, in no acute distress HEENT: Head is normocephalic. Extraocular movements are intact. Oropharynx is clear. Neck: Neck is supple, no palpable cervical or supraclavicular lymphadenopathy. Heart: Regular in rate and rhythm with no murmurs, rubs, or gallops. Chest: Clear to auscultation bilaterally, with no rhonchi, wheezes, or rales. Abdomen: Soft, nontender, nondistended, with no rigidity or guarding. Extremities: No cyanosis or edema. Lymphatics: see Neck Exam Skin: No concerning lesions. Musculoskeletal: symmetric strength and muscle tone throughout. Neurologic: Cranial nerves II through XII are grossly intact. No obvious focalities. Speech is fluent. Coordination is intact. Psychiatric: Judgment and insight are intact. Affect is appropriate. Breasts: in the 1:30 region of the left breast there is a 2.5 cm palpable mass. Otherwise, no right breast masses nor any axillary masses b/l   ECOG = 0  0 -  Asymptomatic (Fully active, able to carry on all predisease activities without restriction)  1 - Symptomatic but completely ambulatory (Restricted in physically strenuous activity but ambulatory and able to carry out work of a light or sedentary nature. For example, light housework, office work)  2 -  Symptomatic, <50% in bed during the day (Ambulatory and capable of all self care but unable to carry out any work activities. Up and about more than 50% of waking hours)  3 - Symptomatic, >50% in bed, but not bedbound (Capable of only limited self-care, confined to bed or chair 50% or more of waking hours)  4 - Bedbound (Completely disabled. Cannot carry on any self-care. Totally confined to bed or chair)  5 - Death   Eustace Pen MM, Creech RH, Tormey DC, et al. [phone]). "Toxicity and response criteria of the [facility]". [place] Oncol. 5 (6): 649-55   LABORATORY DATA:  Lab Results  Component Value Date   WBC 8.0 06/09/2015   HGB 11.2* 06/09/2015   HCT 35.1 06/09/2015   MCV 74.5* 06/09/2015   PLT 334 06/09/2015   CMP     Component Value Date/Time   NA 142 06/09/2015 1222   NA 140 07/03/2014 2017   K 4.3 06/09/2015 1222   K 4.7 07/03/2014 2017   CL 101 07/03/2014 2017   CO2 27 06/09/2015 1222   CO2 25 07/03/2014 2017   GLUCOSE 97 06/09/2015 1222   GLUCOSE 113* 07/03/2014 2017   BUN 12.9 06/09/2015 1222   BUN 9 07/03/2014 2017   CREATININE 0.8 06/09/2015 1222   CREATININE 0.70 07/03/2014 2017   CALCIUM 10.3 06/09/2015 1222   CALCIUM 10.2 07/03/2014 2017   PROT 7.6 06/09/2015 1222   PROT 8.1 07/03/2014 2017   ALBUMIN 4.4 06/09/2015 1222   ALBUMIN 4.1 07/03/2014 2017   AST 17 06/09/2015 1222   AST 16 07/03/2014 2017   ALT 23 06/09/2015 1222   ALT 16 07/03/2014 2017   ALKPHOS 127 06/09/2015 1222   ALKPHOS 119* 07/03/2014 2017   BILITOT 0.28 06/09/2015 1222   BILITOT 0.3 07/03/2014 2017   GFRNONAA >90 07/03/2014 2017   GFRAA >90 07/03/2014 2017         RADIOGRAPHY: US Aspiration  06/04/2015   ADDENDUM REPORT: 06/04/2015 08:37  ADDENDUM: Pathology revealed grade I to II invasive ductal carcinoma with associated microcalcifications in the left breast. This was found to be concordant by Dr. Claudie Revering. Pathology was discussed with the patient by  telephone. She reported doing well after the biopsy with tenderness at the site. Post biopsy instructions and care were reviewed and her questions were answered. She has been scheduled at [facility] on June 09, 2015. She is encouraged to come to [facility] for educational materials. My number was provided for additional concerns and questions.  Pathology results reported by Susa Raring RN, BSN on June 04, 2015.   Electronically Signed   By: Ammie Ferrier M.D.   On: 06/04/2015 08:37   06/04/2015   CLINICAL DATA:  54 year old female with a 1.6 cm mass in the left upper outer quadrant detected on screening mammogram follow up with ultrasound.  EXAM: ULTRASOUND GUIDED LEFT BREAST CORE NEEDLE BIOPSY  ULTRASOUND-GUIDED LEFT BREAST CYST ASPIRATION  COMPARISON:  Previous exam(s) including mammogram and ultrasound dated 06/02/2015.  FINDINGS: I met with the patient and we discussed the procedure of ultrasound-guided biopsy, including benefits and alternatives. We discussed the high  likelihood of a successful procedure. We discussed the risks of the procedure, including infection, bleeding, tissue injury, clip migration, and inadequate sampling. Informed written consent was given. The usual time-out protocol was performed immediately prior to the procedure.  Using sterile technique and 1% Lidocaine as local anesthetic, under direct ultrasound visualization, a 12 gauge spring-loaded device was used to perform biopsy of the 1.6 cm mass in the left upper outer quadrant at 130 using a lateral approach. At the conclusion of the procedure a ribbon shaped tissue marker clip was deployed into the biopsy cavity. Follow up 2 view mammogram was performed and dictated separately.  Next, aspiration was performed for the 1.0 cm cyst in the left breast at 11 o'clock. Using sterile technique and 1% lidocaine as local anesthetic, under direct ultrasound visualization  the tip of a 18 gauge spinal needle was directed into the small cyst. This cyst was seen to completely collapse after aspiration.  IMPRESSION: 1. Ultrasound guided biopsy of the 1.6 cm mass in the upper outer quadrant of the left breast. No apparent complications.  2. Successful aspiration of the 1.0 cm benign cyst in the left breast at 11 o'clock.  Electronically Signed: By: Ammie Ferrier M.D. On: 06/02/2015 12:59   Mm Digital Diagnostic Unilat L  06/02/2015   CLINICAL DATA:  Postprocedure mammogram of the left breast for clip placement following ultrasound-guided biopsy of a 1.6 cm mass.  EXAM: DIAGNOSTIC LEFT MAMMOGRAM POST ULTRASOUND BIOPSY  COMPARISON:  Previous exam(s).  FINDINGS: Mammographic images were obtained following ultrasound guided biopsy of a 1.6 cm left breast mass. A ribbon shaped biopsy marking clip is seen at the site of biopsy in the left breast. Of note, the clip resides at the concerning area of distortion seen mammographically.  IMPRESSION: A ribbon shaped biopsy marking clip resides at the site of biopsy in the upper outer quadrant of the left breast, and also corresponds with the concerning area of distortion on mammogram.  Final Assessment: Post Procedure Mammograms for Marker Placement   Electronically Signed   By: Ammie Ferrier M.D.   On: 06/02/2015 13:03   US Breast Ltd Uni Left Inc Axilla  06/02/2015   CLINICAL DATA:  54 year old female presenting for diagnostic imaging possible left breast asymmetries.  EXAM: DIGITAL DIAGNOSTIC LEFT MAMMOGRAM WITH 3D TOMOSYNTHESIS WITH CAD  ULTRASOUND LEFT BREAST  COMPARISON:  Previous exam(s).  ACR Breast Density Category c: The breast tissue is heterogeneously dense, which may obscure small masses.  FINDINGS: On spot tomosynthesis views of the upper outer quadrant of the left breast, there is persistent asymmetry with possible distortion at the approximate 130 position, posterior depth. Spot tomosynthesis views of the upper inner  quadrant demonstrate a persistent asymmetry at the approximate 11 o'clock position, posterior depth. A third questioned asymmetry in the 6 o'clock position of the left breast appears stable from prior exams, and on tomosynthesis images appears consistent with normal fibroglandular tissue.  Mammographic images were processed with CAD.  On physical exam, there is a possible 1-2 cm mass at the 130 position. No definite masses palpated in the upper inner quadrant of the left breast.  Targeted ultrasound is performed, showing a hypoechoic mass at 130 and left breast, 7 cm from the nipple measuring approximately 1.6 x 0.9 x 1.4 cm. There is a border along the superior margin which appears indistinct. There may be a small cystic area within the mass.  The left axilla was scanned demonstrating normal, benign-appearing lymph nodes.  Ultrasound targeted  to the upper inner quadrant of left breast at 11 o'clock, 8 cm from the nipple demonstrates a primarily anechoic mass with circumscribed margins measuring 1.0 x 0.4 x 0.7 cm. There is a hypoechoic area within the cyst, possibly debris.  IMPRESSION: 1. A 1.6 cm mass with a possible internal cystic area, and with an indistinct superior border, corresponds with the mammographic area of concern in the upper outer left breast.  2. There is a 1.0 cm probable complicated cyst in the upper inner quadrant of the left breast, corresponding to the mammographic asymmetry.  RECOMMENDATION: 1. Ultrasound guided biopsy is recommended for the mass in the left breast at 130.  2. Ultrasound-guided cyst aspiration/possible biopsy is recommended for the probable complicated cyst in the upper inner quadrant of the left breast.  3. The recommended procedures have been added on same-day, and the procedure report will be dictated separately.  I have discussed the findings and recommendations with the patient. Results were also provided in writing at the conclusion of the visit. If applicable, a  reminder letter will be sent to the patient regarding the next appointment.  BI-RADS CATEGORY  4: Suspicious.   Electronically Signed   By: Ammie Ferrier M.D.   On: 06/02/2015 10:29   Mm Diag Breast Tomo Uni Left  06/02/2015   CLINICAL DATA:  54 year old female presenting for diagnostic imaging possible left breast asymmetries.  EXAM: DIGITAL DIAGNOSTIC LEFT MAMMOGRAM WITH 3D TOMOSYNTHESIS WITH CAD  ULTRASOUND LEFT BREAST  COMPARISON:  Previous exam(s).  ACR Breast Density Category c: The breast tissue is heterogeneously dense, which may obscure small masses.  FINDINGS: On spot tomosynthesis views of the upper outer quadrant of the left breast, there is persistent asymmetry with possible distortion at the approximate 130 position, posterior depth. Spot tomosynthesis views of the upper inner quadrant demonstrate a persistent asymmetry at the approximate 11 o'clock position, posterior depth. A third questioned asymmetry in the 6 o'clock position of the left breast appears stable from prior exams, and on tomosynthesis images appears consistent with normal fibroglandular tissue.  Mammographic images were processed with CAD.  On physical exam, there is a possible 1-2 cm mass at the 130 position. No definite masses palpated in the upper inner quadrant of the left breast.  Targeted ultrasound is performed, showing a hypoechoic mass at 130 and left breast, 7 cm from the nipple measuring approximately 1.6 x 0.9 x 1.4 cm. There is a border along the superior margin which appears indistinct. There may be a small cystic area within the mass.  The left axilla was scanned demonstrating normal, benign-appearing lymph nodes.  Ultrasound targeted to the upper inner quadrant of left breast at 11 o'clock, 8 cm from the nipple demonstrates a primarily anechoic mass with circumscribed margins measuring 1.0 x 0.4 x 0.7 cm. There is a hypoechoic area within the cyst, possibly debris.  IMPRESSION: 1. A 1.6 cm mass with a possible  internal cystic area, and with an indistinct superior border, corresponds with the mammographic area of concern in the upper outer left breast.  2. There is a 1.0 cm probable complicated cyst in the upper inner quadrant of the left breast, corresponding to the mammographic asymmetry.  RECOMMENDATION: 1. Ultrasound guided biopsy is recommended for the mass in the left breast at 130.  2. Ultrasound-guided cyst aspiration/possible biopsy is recommended for the probable complicated cyst in the upper inner quadrant of the left breast.  3. The recommended procedures have been added on same-day, and the  procedure report will be dictated separately.  I have discussed the findings and recommendations with the patient. Results were also provided in writing at the conclusion of the visit. If applicable, a reminder letter will be sent to the patient regarding the next appointment.  BI-RADS CATEGORY  4: Suspicious.   Electronically Signed   By: Ammie Ferrier M.D.   On: 06/02/2015 10:29   [place] Lt Breast Bx W Loc Dev 1st Lesion Img Bx Spec US Guide  06/04/2015   ADDENDUM REPORT: 06/04/2015 08:37  ADDENDUM: Pathology revealed grade I to II invasive ductal carcinoma with associated microcalcifications in the left breast. This was found to be concordant by Dr. Claudie Revering. Pathology was discussed with the patient by telephone. She reported doing well after the biopsy with tenderness at the site. Post biopsy instructions and care were reviewed and her questions were answered. She has been scheduled at [facility] on June 09, 2015. She is encouraged to come to [facility] for educational materials. My number was provided for additional concerns and questions.  Pathology results reported by Susa Raring RN, BSN on June 04, 2015.   Electronically Signed   By: Ammie Ferrier M.D.   On: 06/04/2015 08:37   06/04/2015   CLINICAL DATA:  54 year old female with a 1.6  cm mass in the left upper outer quadrant detected on screening mammogram follow up with ultrasound.  EXAM: ULTRASOUND GUIDED LEFT BREAST CORE NEEDLE BIOPSY  ULTRASOUND-GUIDED LEFT BREAST CYST ASPIRATION  COMPARISON:  Previous exam(s) including mammogram and ultrasound dated 06/02/2015.  FINDINGS: I met with the patient and we discussed the procedure of ultrasound-guided biopsy, including benefits and alternatives. We discussed the high likelihood of a successful procedure. We discussed the risks of the procedure, including infection, bleeding, tissue injury, clip migration, and inadequate sampling. Informed written consent was given. The usual time-out protocol was performed immediately prior to the procedure.  Using sterile technique and 1% Lidocaine as local anesthetic, under direct ultrasound visualization, a 12 gauge spring-loaded device was used to perform biopsy of the 1.6 cm mass in the left upper outer quadrant at 130 using a lateral approach. At the conclusion of the procedure a ribbon shaped tissue marker clip was deployed into the biopsy cavity. Follow up 2 view mammogram was performed and dictated separately.  Next, aspiration was performed for the 1.0 cm cyst in the left breast at 11 o'clock. Using sterile technique and 1% lidocaine as local anesthetic, under direct ultrasound visualization the tip of a 18 gauge spinal needle was directed into the small cyst. This cyst was seen to completely collapse after aspiration.  IMPRESSION: 1. Ultrasound guided biopsy of the 1.6 cm mass in the upper outer quadrant of the left breast. No apparent complications.  2. Successful aspiration of the 1.0 cm benign cyst in the left breast at 11 o'clock.  Electronically Signed: By: Ammie Ferrier M.D. On: 06/02/2015 12:59      IMPRESSION/PLAN:Stage I left breast cancer, ER+ She has been discussed at our multidisciplinary tumor board.  The consensus is that she be a good candidate for breast conservation. I talked to  her about the option of a mastectomy and informed her that her expected overall survival would be equivalent between mastectomy and breast conservation, based upon randomized controlled data. She is enthusiastic about breast conservation.  The patient as discussed at tumor board  is a good candiate for lumpectomy and sentinel lymph node biopsy. Oncotype  testing will be ordered after surgery (if Her2 is negative - otherwise, proceed w/ chemotherapy). We discussed my recommendations for adjuvant radiotherapy to the left female breast, which would decrease her risk of a loco-regional occurrence by two-third's. We disucssed the risks, benefits, and side-effects of this treatment. I look forward to seeing her back in my clinic when she is ready for treatment planning. The patient was advised that if she develops any further questions or concerns in regards to her treatment, to contact Dr. Isidore Moos, MD.  This document serves as a record of services personally performed by Eppie Gibson, MD. It was created on her behalf by Lenn Cal, a trained medical scribe. The creation of this record is based on the scribe's personal observations and the provider's statements to them. This document has been checked and approved by the attending provider.  __________________________________________   Eppie Gibson, MD

## 2015-06-09 NOTE — Progress Notes (Signed)
[facility] Psychosocial Distress Screening Spiritual Care  Met with Norma Robles and daughters Norma Robles and Norma Robles (lives in [place]) to introduce [facility] team/resources, and to review distress screen per protocol.  The patient scored a 1 on the Psychosocial Distress Thermometer which indicates mild distress. Also assessed for distress and other psychosocial needs.   ONCBCN DISTRESS SCREENING 06/09/2015  Screening Type Initial Screening  Distress experienced in past week (1-10) 1  Emotional problem type Nervousness/Anxiety  Referral to support programs Yes  Other [place] was in good spirits, reporting very little distress because she "just knows" that God is with her and caring for her through this.  She reports strong support from family, friends, and church Education officer, museum near [place]).  Prayer and faith are primary sources of meaning-making and coping for her.  Per pt, she feels comfortable with tx team, prepared to go forward, and so grateful that this is a manageable dx/tx plan.    Follow up needed: No.  Family is aware of ongoing availability of chaplain and [place] team, as well as contact info and programming, but please also page as needs arise.  Thank you.  [place], [place] Pager [phone] Voicemail  [phone]

## 2015-06-09 NOTE — Progress Notes (Signed)
MD note created during office visit provided to patient.  Copy sent to scan.

## 2015-06-11 ENCOUNTER — Other Ambulatory Visit: Payer: Self-pay

## 2015-06-11 ENCOUNTER — Other Ambulatory Visit: Payer: Self-pay | Admitting: General Surgery

## 2015-06-11 DIAGNOSIS — C50912 Malignant neoplasm of unspecified site of left female breast: Secondary | ICD-10-CM

## 2015-06-14 ENCOUNTER — Encounter (HOSPITAL_BASED_OUTPATIENT_CLINIC_OR_DEPARTMENT_OTHER)
Admission: RE | Admit: 2015-06-14 | Discharge: 2015-06-14 | Disposition: A | Discharge status: Home / Self Care | Payer: BLUE CROSS/BLUE SHIELD | Source: Ambulatory Visit | Attending: General Surgery | Admitting: General Surgery

## 2015-06-14 ENCOUNTER — Telehealth: Payer: Self-pay | Admitting: Hematology and Oncology

## 2015-06-14 ENCOUNTER — Other Ambulatory Visit: Payer: Self-pay

## 2015-06-14 ENCOUNTER — Encounter (HOSPITAL_BASED_OUTPATIENT_CLINIC_OR_DEPARTMENT_OTHER): Payer: Self-pay

## 2015-06-14 DIAGNOSIS — Z803 Family history of malignant neoplasm of breast: Secondary | ICD-10-CM | POA: Diagnosis not present

## 2015-06-14 DIAGNOSIS — C50412 Malignant neoplasm of upper-outer quadrant of left female breast: Secondary | ICD-10-CM | POA: Diagnosis not present

## 2015-06-14 DIAGNOSIS — E780 Pure hypercholesterolemia: Secondary | ICD-10-CM | POA: Diagnosis not present

## 2015-06-14 DIAGNOSIS — Z170 Estrogen receptor positive status [ER+]: Secondary | ICD-10-CM | POA: Diagnosis not present

## 2015-06-14 DIAGNOSIS — Z79899 Other long term (current) drug therapy: Secondary | ICD-10-CM | POA: Diagnosis not present

## 2015-06-14 DIAGNOSIS — Z87891 Personal history of nicotine dependence: Secondary | ICD-10-CM | POA: Diagnosis not present

## 2015-06-14 DIAGNOSIS — C50912 Malignant neoplasm of unspecified site of left female breast: Secondary | ICD-10-CM | POA: Diagnosis present

## 2015-06-14 NOTE — Telephone Encounter (Signed)
Confirmed appointment for 08/29

## 2015-06-14 NOTE — Progress Notes (Signed)
Patient to come in today to [facility] for EKG and to [facility] for CXR per Dr. Lear Ng orders.

## 2015-06-15 ENCOUNTER — Ambulatory Visit
Admission: RE | Admit: 2015-06-15 | Discharge: 2015-06-15 | Disposition: A | Discharge status: Home / Self Care | Payer: BLUE CROSS/BLUE SHIELD | Source: Ambulatory Visit | Attending: General Surgery | Admitting: General Surgery

## 2015-06-15 DIAGNOSIS — C50912 Malignant neoplasm of unspecified site of left female breast: Secondary | ICD-10-CM

## 2015-06-16 ENCOUNTER — Telehealth: Payer: Self-pay | Admitting: *Deleted

## 2015-06-16 NOTE — Telephone Encounter (Signed)
Spoke to pt concerning [place] from 06/09/15. Denies questions or concerns regarding dx or treatment care plan. Encourage pt to call with needs. Confirmed surgery date and f/u with Dr. Lindi Adie.

## 2015-06-16 NOTE — H&P (Signed)
History of Present Illness Marland Kitchen T. Monalisa Bayless MD; 06/09/2015 3:24 PM) The patient is a 54 year old female who presents with breast cancer. She is a post menopausal female referred by Dr. Ammie Ferrier for evaluation of recently diagnosed carcinoma of the left breast. She recently presented for a screening mamogram revealing aarchitectural distortion in the upper outer left breast. Subsequent imaging included diagnostic mamogram and tomos synthesis showing persistent distortion at the 1:30 position posterior depth, and at the 11:00 position posterior depth. A third area of asymmetry at the 6:00 position was stable from prior exams, and ultrasound showing a cyst at the 11:00 position which was subsequently aspirated bu a 1.6 cm mass with indistinct borders at the 1:30 position. An ultrasound guided breast biopsy was performed on 06/02/2015 with pathology revealing invasive ductal carcinoma of the breast. She is seen now in breast multidisciplinary clinic for initial treatment planning. She has experienced no breast symptoms, specifically lump or pain, nipple discharge or skin changes. She does not have a personal history of any previous breast problems.  Findings at that time were the following: Tumor size: 1.6 cm Tumor grade: 1-2 Estrogen Receptor: Pos Progesterone Receptor: Pos Her-2 neu: Neg Lymph node status: Ned    Other Problems Patsey Berthold, CMA; 06/09/2015 8:26 AM) Breast Cancer Cholelithiasis Hypercholesterolemia Lump In Breast Other disease, cancer, significant illness  Past Surgical History Patsey Berthold, [place]; 06/09/2015 8:26 AM) Breast Biopsy Left. Colon Polyp Removal - Colonoscopy Gallbladder Surgery - Laparoscopic Oral Surgery Tonsillectomy  Diagnostic Studies History Patsey Berthold, [place]; 06/09/2015 8:26 AM) Colonoscopy 1-5 years ago Mammogram within last year Pap Smear 1-5 years ago  Social History Patsey Berthold, [place]; 06/09/2015 8:26  AM) Alcohol use Occasional alcohol use. Caffeine use Carbonated beverages, Coffee, Tea. No drug use Tobacco use Former smoker.  Family History Patsey Berthold, [place]; 06/09/2015 8:26 AM) Alcohol Abuse Father. Arthritis Mother. Bleeding disorder Mother. Breast Cancer Family Members In General. Cancer Mother. Colon Polyps Brother, Sister. Depression Sister. Diabetes Mellitus Mother. Heart Disease Father, Mother. Heart disease in female family member before age 27 Hypertension Brother, Father. Kidney Disease Brother. Respiratory Condition Daughter. Thyroid problems Brother, Mother, Sister.  Pregnancy / Birth History Patsey Berthold, [place]; 06/09/2015 8:26 AM) Age at menarche 12 years. Age of menopause [age] Contraceptive History Oral contraceptives. Gravida 2 Irregular periods Maternal age 63-25 Para 2  Review of Systems Patsey Berthold CMA; 06/09/2015 8:26 AM) General Not Present- Appetite Loss, Chills, Fatigue, Fever, Night Sweats, Weight Gain and Weight Loss. Skin Not Present- Change in Wart/Mole, Dryness, Hives, Jaundice, New Lesions, Non-Healing Wounds, Rash and Ulcer. HEENT Present- Seasonal Allergies and Wears glasses/contact lenses. Not Present- Earache, Hearing Loss, Hoarseness, Nose Bleed, Oral Ulcers, Ringing in the Ears, Sinus Pain, Sore Throat, Visual Disturbances and Yellow Eyes. Respiratory Not Present- Bloody sputum, Chronic Cough, Difficulty Breathing, Snoring and Wheezing. Breast Not Present- Breast Mass, Breast Pain, Nipple Discharge and Skin Changes. Cardiovascular Not Present- Chest Pain, Difficulty Breathing Lying Down, Leg Cramps, Palpitations, Rapid Heart Rate, Shortness of Breath and Swelling of Extremities. Gastrointestinal Present- Abdominal Pain, Bloating and Constipation. Not Present- Bloody Stool, Change in Bowel Habits, Chronic diarrhea, Difficulty Swallowing, Excessive gas, Gets full quickly at meals, Hemorrhoids, Indigestion, Nausea,  Rectal Pain and Vomiting. Female Genitourinary Not Present- Frequency, Nocturia, Painful Urination, Pelvic Pain and Urgency. Musculoskeletal Not Present- Back Pain, Joint Pain, Joint Stiffness, Muscle Pain, Muscle Weakness and Swelling of Extremities. Neurological Not Present- Decreased Memory, Fainting, Headaches, Numbness, Seizures, Tingling, Tremor, Trouble walking and Weakness. Psychiatric Not  Present- Anxiety, Bipolar, Change in Sleep Pattern, Depression, Fearful and Frequent crying. Endocrine Present- Hot flashes. Not Present- Cold Intolerance, Excessive Hunger, Hair Changes, Heat Intolerance and New Diabetes. Hematology Not Present- Easy Bruising, Excessive bleeding, Gland problems, HIV and Persistent Infections.   Physical Exam Marland Kitchen T. Ercie Eliasen MD; 06/09/2015 3:25 PM) The physical exam findings are as follows: Note:General: Alert, well-developed and well nourished Caucasian female, in no distress Skin: Warm and dry without rash or infection. HEENT: No palpable masses or thyromegaly. Sclera nonicteric. Lymph nodes: No cervical, supraclavicular, or inguinal nodes palpable. Breasts: There is some palpable thickening 2-3 cm in the upper outer left breast possibly consistent with postbiopsy change. No other palpable abnormalities in either breast. No skin changes or nipple inversion or crusting. No palpable axillary adenopathy. Lungs: Breath sounds clear and equal. No wheezing or increased work of breathing. Cardiovascular: Regular rate and rhythm without murmer. No JVD or edema. Abdomen: Nondistended. Soft and nontender. No masses palpable. No organomegaly. No palpable hernias. Extremities: No edema or joint swelling or deformity. No chronic venous stasis changes. Neurologic: Alert and fully oriented. Gait normal. No focal weakness. Psychiatric: Normal mood and affect. Thought content appropriate with normal judgement and insight    Assessment & Plan Marland Kitchen T. Abdifatah Colquhoun MD;  06/09/2015 3:31 PM) PRIMARY CANCER OF UPPER OUTER QUADRANT OF LEFT FEMALE BREAST (174.4  C50.412) Impression: 54 year old female with a new diagnosis of cancer of the left breast, upper outer quadrant. Clinical stage 1 a, ER ppositive, PR positive, HER-2 negative. I discussed with the patient and family members present today initial surgical treatment options. We discussed options of breast conservation with lumpectomy or total mastectomy and sentinal lymph node biopsy/dissection. she would appear to be a good candidate for breast conservation therapy After discussion they have elected to proceed with breast conservation with lumpectomy and sentinel lymph node biopsy. We discussed the indications and nature of the procedure, and expected recovery, in detail. Surgical risks including anesthetic complications, cardiorespiratory complications, bleeding, infection, wound healing complications, blood clots, lymphedema, local and distant recurrence and possible need for further surgery based on the final pathology was discussed and understood. Chemotherapy, hormonal therapy and radiation therapy have been discussed. They have been provided with literature regarding the treatment of breast cancer. All questions were answered. They understand and agree to proceed and we will go ahead with scheduling. Current Plans  Pt Education - [facility] Breast Biopsy HCI Schedule for Surgery Radioactive seed localized left breast lumpectomy and left axillary sentinal lymph node biopsy under general anesthesia as an outpatient

## 2015-06-17 ENCOUNTER — Ambulatory Visit (HOSPITAL_BASED_OUTPATIENT_CLINIC_OR_DEPARTMENT_OTHER): Payer: BLUE CROSS/BLUE SHIELD | Admitting: Anesthesiology

## 2015-06-17 ENCOUNTER — Ambulatory Visit (HOSPITAL_BASED_OUTPATIENT_CLINIC_OR_DEPARTMENT_OTHER)
Admission: RE | Admit: 2015-06-17 | Discharge: 2015-06-17 | Disposition: A | Discharge status: Home / Self Care | Payer: BLUE CROSS/BLUE SHIELD | Source: Ambulatory Visit | Attending: General Surgery | Admitting: General Surgery

## 2015-06-17 ENCOUNTER — Encounter (HOSPITAL_COMMUNITY)
Admission: RE | Admit: 2015-06-17 | Discharge: 2015-06-17 | Disposition: A | Discharge status: Home / Self Care | Payer: BLUE CROSS/BLUE SHIELD | Source: Ambulatory Visit | Attending: General Surgery | Admitting: General Surgery

## 2015-06-17 ENCOUNTER — Encounter (HOSPITAL_BASED_OUTPATIENT_CLINIC_OR_DEPARTMENT_OTHER): Payer: Self-pay | Admitting: Anesthesiology

## 2015-06-17 ENCOUNTER — Ambulatory Visit
Admission: RE | Admit: 2015-06-17 | Discharge: 2015-06-17 | Disposition: A | Discharge status: Home / Self Care | Payer: BLUE CROSS/BLUE SHIELD | Source: Ambulatory Visit | Attending: General Surgery | Admitting: General Surgery

## 2015-06-17 ENCOUNTER — Encounter (HOSPITAL_BASED_OUTPATIENT_CLINIC_OR_DEPARTMENT_OTHER)
Admission: RE | Disposition: A | Discharge status: Home / Self Care | Payer: Self-pay | Source: Ambulatory Visit | Attending: General Surgery

## 2015-06-17 DIAGNOSIS — E780 Pure hypercholesterolemia: Secondary | ICD-10-CM | POA: Insufficient documentation

## 2015-06-17 DIAGNOSIS — C50412 Malignant neoplasm of upper-outer quadrant of left female breast: Secondary | ICD-10-CM | POA: Insufficient documentation

## 2015-06-17 DIAGNOSIS — C50912 Malignant neoplasm of unspecified site of left female breast: Secondary | ICD-10-CM

## 2015-06-17 DIAGNOSIS — Z87891 Personal history of nicotine dependence: Secondary | ICD-10-CM | POA: Insufficient documentation

## 2015-06-17 DIAGNOSIS — Z170 Estrogen receptor positive status [ER+]: Secondary | ICD-10-CM | POA: Insufficient documentation

## 2015-06-17 DIAGNOSIS — Z79899 Other long term (current) drug therapy: Secondary | ICD-10-CM | POA: Insufficient documentation

## 2015-06-17 DIAGNOSIS — Z803 Family history of malignant neoplasm of breast: Secondary | ICD-10-CM | POA: Insufficient documentation

## 2015-06-17 HISTORY — PX: BREAST LUMPECTOMY: SHX2

## 2015-06-17 HISTORY — PX: BREAST LUMPECTOMY WITH RADIOACTIVE SEED AND SENTINEL LYMPH NODE BIOPSY: SHX6550

## 2015-06-17 SURGERY — BREAST LUMPECTOMY WITH RADIOACTIVE SEED AND SENTINEL LYMPH NODE BIOPSY
Anesthesia: Regional | Site: Breast | Laterality: Left

## 2015-06-17 MED ORDER — SCOPOLAMINE 1 MG/3DAYS TD PT72
1.0000 | MEDICATED_PATCH | Freq: Once | TRANSDERMAL | Status: DC | PRN
Start: 2015-06-17 — End: 2015-06-17

## 2015-06-17 MED ORDER — CEFAZOLIN SODIUM-DEXTROSE 2-3 GM-% IV SOLR
2.0000 g | INTRAVENOUS | Status: AC
  Administered 2015-06-17: 2 g via INTRAVENOUS

## 2015-06-17 MED ORDER — ONDANSETRON HCL 4 MG/2ML IJ SOLN
INTRAMUSCULAR | Status: DC | PRN
  Administered 2015-06-17: 4 mg via INTRAVENOUS

## 2015-06-17 MED ORDER — CHLORHEXIDINE GLUCONATE 4 % EX LIQD: 1.0000 "application " | Freq: Once | CUTANEOUS | Status: DC

## 2015-06-17 MED ORDER — HYDROMORPHONE HCL 1 MG/ML IJ SOLN
INTRAMUSCULAR | Status: AC
  Filled 2015-06-17: qty 1

## 2015-06-17 MED ORDER — PROMETHAZINE HCL 25 MG/ML IJ SOLN: 6.2500 mg | INTRAMUSCULAR | Status: DC | PRN

## 2015-06-17 MED ORDER — FENTANYL CITRATE (PF) 100 MCG/2ML IJ SOLN
INTRAMUSCULAR | Status: AC
  Filled 2015-06-17: qty 6

## 2015-06-17 MED ORDER — LACTATED RINGERS IV SOLN
INTRAVENOUS | Status: DC
  Administered 2015-06-17: 07:00:00 via INTRAVENOUS

## 2015-06-17 MED ORDER — SODIUM CHLORIDE 0.9 % IJ SOLN
INTRAMUSCULAR | Status: DC | PRN
Start: 2015-06-17 — End: 2015-06-17
  Administered 2015-06-17: 5 mL via INTRAMUSCULAR

## 2015-06-17 MED ORDER — MIDAZOLAM HCL 2 MG/2ML IJ SOLN
1.0000 mg | INTRAMUSCULAR | Status: DC | PRN
  Administered 2015-06-17 (×2): 2 mg via INTRAVENOUS

## 2015-06-17 MED ORDER — HYDROCODONE-ACETAMINOPHEN 5-325 MG PO TABS: 1.0000 | ORAL_TABLET | ORAL | Status: DC | PRN

## 2015-06-17 MED ORDER — MEPERIDINE HCL 25 MG/ML IJ SOLN: 6.2500 mg | INTRAMUSCULAR | Status: DC | PRN

## 2015-06-17 MED ORDER — BUPIVACAINE-EPINEPHRINE (PF) 0.5% -1:200000 IJ SOLN
INTRAMUSCULAR | Status: AC
  Filled 2015-06-17: qty 30

## 2015-06-17 MED ORDER — SODIUM CHLORIDE 0.9 % IJ SOLN
INTRAMUSCULAR | Status: AC
  Filled 2015-06-17: qty 10

## 2015-06-17 MED ORDER — FENTANYL CITRATE (PF) 100 MCG/2ML IJ SOLN
50.0000 ug | INTRAMUSCULAR | Status: DC | PRN
  Administered 2015-06-17: 50 ug via INTRAVENOUS
  Administered 2015-06-17: 100 ug via INTRAVENOUS

## 2015-06-17 MED ORDER — LIDOCAINE HCL (CARDIAC) 20 MG/ML IV SOLN
INTRAVENOUS | Status: DC | PRN
  Administered 2015-06-17: 50 mg via INTRAVENOUS

## 2015-06-17 MED ORDER — METHYLENE BLUE 1 % INJ SOLN
INTRAMUSCULAR | Status: AC
  Filled 2015-06-17: qty 10

## 2015-06-17 MED ORDER — OXYCODONE HCL 5 MG/5ML PO SOLN: 5.0000 mg | Freq: Once | ORAL | Status: DC | PRN

## 2015-06-17 MED ORDER — DEXAMETHASONE SODIUM PHOSPHATE 4 MG/ML IJ SOLN
INTRAMUSCULAR | Status: DC | PRN
  Administered 2015-06-17: 10 mg via INTRAVENOUS

## 2015-06-17 MED ORDER — FENTANYL CITRATE (PF) 100 MCG/2ML IJ SOLN
INTRAMUSCULAR | Status: AC
  Filled 2015-06-17: qty 2

## 2015-06-17 MED ORDER — MIDAZOLAM HCL 2 MG/2ML IJ SOLN
INTRAMUSCULAR | Status: AC
  Filled 2015-06-17: qty 2

## 2015-06-17 MED ORDER — GLYCOPYRROLATE 0.2 MG/ML IJ SOLN: 0.2000 mg | Freq: Once | INTRAMUSCULAR | Status: DC | PRN

## 2015-06-17 MED ORDER — PROPOFOL 10 MG/ML IV BOLUS
INTRAVENOUS | Status: DC | PRN
  Administered 2015-06-17: 150 mg via INTRAVENOUS

## 2015-06-17 MED ORDER — OXYCODONE HCL 5 MG PO TABS: 5.0000 mg | ORAL_TABLET | Freq: Once | ORAL | Status: DC | PRN

## 2015-06-17 MED ORDER — HYDROMORPHONE HCL 1 MG/ML IJ SOLN
0.2500 mg | INTRAMUSCULAR | Status: DC | PRN
  Administered 2015-06-17: 0.25 mg via INTRAVENOUS
  Administered 2015-06-17: 0.5 mg via INTRAVENOUS
  Administered 2015-06-17: 0.25 mg via INTRAVENOUS

## 2015-06-17 MED ORDER — BUPIVACAINE-EPINEPHRINE (PF) 0.5% -1:200000 IJ SOLN
INTRAMUSCULAR | Status: DC | PRN
  Administered 2015-06-17: 10 mL via PERINEURAL

## 2015-06-17 MED ORDER — CEFAZOLIN SODIUM-DEXTROSE 2-3 GM-% IV SOLR
INTRAVENOUS | Status: AC
  Filled 2015-06-17: qty 50

## 2015-06-17 MED ORDER — TECHNETIUM TC 99M SULFUR COLLOID FILTERED
1.0000 | Freq: Once | INTRAVENOUS | Status: AC | PRN
  Administered 2015-06-17: 1 via INTRADERMAL

## 2015-06-17 SURGICAL SUPPLY — 51 items
APPLIER CLIP 9.375 MED OPEN (MISCELLANEOUS) ×3
APR CLP MED 9.3 20 MLT OPN (MISCELLANEOUS) ×1
BINDER BREAST LRG (GAUZE/BANDAGES/DRESSINGS) ×2 IMPLANT
BINDER BREAST MEDIUM (GAUZE/BANDAGES/DRESSINGS) IMPLANT
BINDER BREAST XLRG (GAUZE/BANDAGES/DRESSINGS) IMPLANT
BINDER BREAST XXLRG (GAUZE/BANDAGES/DRESSINGS) IMPLANT
BLADE SURG 15 STRL LF DISP TIS (BLADE) ×1 IMPLANT
BLADE SURG 15 STRL SS (BLADE) ×3
CANISTER SUC SOCK COL 7IN (MISCELLANEOUS) IMPLANT
CANISTER SUCT 1200ML W/VALVE (MISCELLANEOUS) ×2 IMPLANT
CHLORAPREP W/TINT 26ML (MISCELLANEOUS) ×3 IMPLANT
CLIP APPLIE 9.375 MED OPEN (MISCELLANEOUS) ×1 IMPLANT
COVER BACK TABLE 60X90IN (DRAPES) ×3 IMPLANT
COVER MAYO STAND STRL (DRAPES) ×3 IMPLANT
COVER PROBE W GEL 5X96 (DRAPES) ×3 IMPLANT
DECANTER SPIKE VIAL GLASS SM (MISCELLANEOUS) IMPLANT
DEVICE DUBIN W/COMP PLATE 8390 (MISCELLANEOUS) ×3 IMPLANT
DRAPE LAPAROSCOPIC ABDOMINAL (DRAPES) ×3 IMPLANT
DRAPE UTILITY XL STRL (DRAPES) ×3 IMPLANT
ELECT COATED BLADE 2.86 ST (ELECTRODE) ×3 IMPLANT
ELECT REM PT RETURN 9FT ADLT (ELECTROSURGICAL) ×3
ELECTRODE REM PT RTRN 9FT ADLT (ELECTROSURGICAL) ×1 IMPLANT
GLOVE BIOGEL PI IND STRL 7.5 (GLOVE) IMPLANT
GLOVE BIOGEL PI IND STRL 8 (GLOVE) ×1 IMPLANT
GLOVE BIOGEL PI INDICATOR 7.5 (GLOVE) ×4
GLOVE BIOGEL PI INDICATOR 8 (GLOVE) ×2
GLOVE ECLIPSE 7.5 STRL STRAW (GLOVE) ×5 IMPLANT
GLOVE SURG SS PI 7.5 STRL IVOR (GLOVE) ×2 IMPLANT
GOWN STRL REUS W/ TWL LRG LVL3 (GOWN DISPOSABLE) ×1 IMPLANT
GOWN STRL REUS W/ TWL XL LVL3 (GOWN DISPOSABLE) ×1 IMPLANT
GOWN STRL REUS W/TWL LRG LVL3 (GOWN DISPOSABLE) ×3
GOWN STRL REUS W/TWL XL LVL3 (GOWN DISPOSABLE) ×3
KIT MARKER MARGIN INK (KITS) ×3 IMPLANT
LIQUID BAND (GAUZE/BANDAGES/DRESSINGS) ×3 IMPLANT
NDL HYPO 25X1 1.5 SAFETY (NEEDLE) ×2 IMPLANT
NDL SAFETY ECLIPSE 18X1.5 (NEEDLE) ×1 IMPLANT
NEEDLE HYPO 18GX1.5 SHARP (NEEDLE)
NEEDLE HYPO 25X1 1.5 SAFETY (NEEDLE) ×6 IMPLANT
NS IRRIG 1000ML POUR BTL (IV SOLUTION) ×2 IMPLANT
PACK BASIN DAY SURGERY FS (CUSTOM PROCEDURE TRAY) ×3 IMPLANT
PENCIL BUTTON HOLSTER BLD 10FT (ELECTRODE) ×3 IMPLANT
SLEEVE SCD COMPRESS KNEE MED (MISCELLANEOUS) ×3 IMPLANT
SPONGE LAP 4X18 X RAY DECT (DISPOSABLE) ×3 IMPLANT
SUT MON AB 5-0 PS2 18 (SUTURE) ×3 IMPLANT
SUT VICRYL 3-0 CR8 SH (SUTURE) ×3 IMPLANT
SYR CONTROL 10ML LL (SYRINGE) ×6 IMPLANT
TOWEL OR 17X24 6PK STRL BLUE (TOWEL DISPOSABLE) ×3 IMPLANT
TOWEL OR NON WOVEN STRL DISP B (DISPOSABLE) ×3 IMPLANT
TUBE CONNECTING 20'X1/4 (TUBING)
TUBE CONNECTING 20X1/4 (TUBING) IMPLANT
YANKAUER SUCT BULB TIP NO VENT (SUCTIONS) ×2 IMPLANT

## 2015-06-17 NOTE — Anesthesia Procedure Notes (Addendum)
Procedure Name: LMA Insertion Performed by: Terrance Mass Pre-anesthesia Checklist: Patient identified, Timeout performed, Emergency Drugs available, Suction available and Patient being monitored Patient Re-evaluated:Patient Re-evaluated prior to inductionOxygen Delivery Method: Circle system utilized Preoxygenation: Pre-oxygenation with 100% oxygen Intubation Type: IV induction Ventilation: Mask ventilation without difficulty LMA: LMA inserted LMA Size: 4.0 Tube type: Oral Number of attempts: 1 Placement Confirmation: positive ETCO2 Tube secured with: Tape Dental Injury: Teeth and Oropharynx as per pre-operative assessment    Anesthesia Regional Block:  Pectoralis block  Pre-Anesthetic Checklist: ,, timeout performed, Correct Patient, Correct Site, Correct Laterality, Correct Procedure, Correct Position, site marked, Risks and benefits discussed, Surgical consent,  Pre-op evaluation,  Post-op pain management  Laterality: Left  Prep: chloraprep       Needles:   Needle Type: Stimiplex     Needle Length: 9cm 9 cm     Additional Needles:  Procedures: ultrasound guided (picture in chart) Pectoralis block Narrative:  Injection made incrementally with aspirations every 5 mL.  Performed by: Personally  Anesthesiologist: Nolon Nations  Additional Notes: Patient tolerated well. Good fascial spread noted.

## 2015-06-17 NOTE — Discharge Instructions (Signed)
[facility] °Office Phone Number [phone] ° °BREAST BIOPSY/ PARTIAL MASTECTOMY: POST OP INSTRUCTIONS ° °Always review your discharge instruction sheet given to you by the facility where your surgery was performed. ° °IF YOU HAVE DISABILITY OR FAMILY LEAVE FORMS, YOU MUST BRING THEM TO THE OFFICE FOR PROCESSING.  DO NOT GIVE THEM TO YOUR DOCTOR. ° °1. A prescription for pain medication may be given to you upon discharge.  Take your pain medication as prescribed, if needed.  If narcotic pain medicine is not needed, then you may take acetaminophen (Tylenol) or ibuprofen (Advil) as needed. °2. Take your usually prescribed medications unless otherwise directed °3. If you need a refill on your pain medication, please contact your pharmacy.  They will contact our office to request authorization.  Prescriptions will not be filled after 5pm or on week-ends. °4. You should eat very light the first 24 hours after surgery, such as soup, crackers, pudding, etc.  Resume your normal diet the day after surgery. °5. Most patients will experience some swelling and bruising in the breast.  Ice packs and a good support bra will help.  Swelling and bruising can take several days to resolve.  °6. It is common to experience some constipation if taking pain medication after surgery.  Increasing fluid intake and taking a stool softener will usually help or prevent this problem from occurring.  A mild laxative (Milk of Magnesia or Miralax) should be taken according to package directions if there are no bowel movements after 48 hours. °7. Unless discharge instructions indicate otherwise, you may remove your bandages 24-48 hours after surgery, and you may shower at that time.  You may have steri-strips (small skin tapes) in place directly over the incision.  These strips should be left on the skin for 7-10 days.  If your surgeon used skin glue on the incision, you may shower in 24 hours.  The glue will flake off over the  next 2-3 weeks.  Any sutures or staples will be removed at the office during your follow-up visit. °8. ACTIVITIES:  You may resume regular daily activities (gradually increasing) beginning the next day.  Wearing a good support bra or sports bra minimizes pain and swelling.  You may have sexual intercourse when it is comfortable. °a. You may drive when you no longer are taking prescription pain medication, you can comfortably wear a seatbelt, and you can safely maneuver your car and apply brakes. °b. RETURN TO WORK:  ______________________________________________________________________________________ °9. You should see your doctor in the office for a follow-up appointment approximately two weeks after your surgery.  Your doctor’s nurse will typically make your follow-up appointment when she calls you with your pathology report.  Expect your pathology report 2-3 business days after your surgery.  You may call to check if you do not hear from us after three days. °10. OTHER INSTRUCTIONS: _______________________________________________________________________________________________ _____________________________________________________________________________________________________________________________________ °_____________________________________________________________________________________________________________________________________ °_____________________________________________________________________________________________________________________________________ ° °WHEN TO CALL YOUR DOCTOR: °1. Fever over 101.0 °2. Nausea and/or vomiting. °3. Extreme swelling or bruising. °4. Continued bleeding from incision. °5. Increased pain, redness, or drainage from the incision. ° °The clinic staff is available to answer your questions during regular business hours.  Please don’t hesitate to call and ask to speak to one of the nurses for clinical concerns.  If you have a medical emergency, go to the nearest  emergency room or call 911.  A surgeon from [facility] is always on call at the hospital. ° °For further questions, please visit [email or web address]  ° ° ° °  Post Anesthesia Home Care Instructions ° °Activity: °Get plenty of rest for the remainder of the day. A responsible adult should stay with you for 24 hours following the procedure.  °For the next 24 hours, DO NOT: °-Drive a car °-Operate machinery °-Drink alcoholic beverages °-Take any medication unless instructed by your physician °-Make any legal decisions or sign important papers. ° °Meals: °Start with liquid foods such as gelatin or soup. Progress to regular foods as tolerated. Avoid greasy, spicy, heavy foods. If nausea and/or vomiting occur, drink only clear liquids until the nausea and/or vomiting subsides. Call your physician if vomiting continues. ° °Special Instructions/Symptoms: °Your throat may feel dry or sore from the anesthesia or the breathing tube placed in your throat during surgery. If this causes discomfort, gargle with warm salt water. The discomfort should disappear within 24 hours. ° °If you had a scopolamine patch placed behind your ear for the management of post- operative nausea and/or vomiting: ° °1. The medication in the patch is effective for 72 hours, after which it should be removed.  Wrap patch in a tissue and discard in the trash. Wash hands thoroughly with soap and water. °2. You may remove the patch earlier than 72 hours if you experience unpleasant side effects which may include dry mouth, dizziness or visual disturbances. °3. Avoid touching the patch. Wash your hands with soap and water after contact with the patch. °  ° °

## 2015-06-17 NOTE — Anesthesia Postprocedure Evaluation (Signed)
Anesthesia Post Note  Patient: Norma Robles  Procedure(s) Performed: Procedure(s) (LRB): RADIOACTIVE SEED LOCALIZATION LEFT BREAST LUMPECTOMY AND LEFT AXILLARY SENTINEL LYMPH NODE BIOPSY (Left)  Anesthesia type: General  Patient location: PACU  Post pain: Pain level controlled  Post assessment: Post-op Vital signs reviewed  Last Vitals: BP 122/75 mmHg  Pulse 70  Temp(Src) 36.7 C (Oral)  Resp 18  Ht 5' (1.524 m)  Wt 150 lb 2 oz (68.096 kg)  BMI 29.32 kg/m2  SpO2 97%  LMP 04/14/2015  Post vital signs: Reviewed  Level of consciousness: sedated  Complications: No apparent anesthesia complications

## 2015-06-17 NOTE — Progress Notes (Signed)
Assisted Dr. Germeroth with left, ultrasound guided, pectoralis block. Side rails up, monitors on throughout procedure. See vital signs in flow sheet. Tolerated Procedure well. 

## 2015-06-17 NOTE — Interval H&P Note (Signed)
History and Physical Interval Note:  06/17/2015 7:21 AM  Norma Robles  has presented today for surgery, with the diagnosis of cancer left breast  The various methods of treatment have been discussed with the patient and family. After consideration of risks, benefits and other options for treatment, the patient has consented to  Procedure(s): RADIOACTIVE SEED LOCALIZATION LEFT BREAST LUMPECTOMY AND LEFT AXILLARY SENTINEL LYMPH NODE BIOPSY (Left) as a surgical intervention .  The patient's history has been reviewed, patient examined, no change in status, stable for surgery.  I have reviewed the patient's chart and labs.  Questions were answered to the patient's satisfaction.     Frances Ambrosino T

## 2015-06-17 NOTE — Op Note (Signed)
Preoperative Diagnosis: cancer left breast  Postoprative Diagnosis: cancer left breast  Procedure: Procedure(s): RADIOACTIVE SEED LOCALIZATION LEFT BREAST LUMPECTOMY AND LEFT AXILLARY SENTINEL LYMPH NODE BIOPSY   Surgeon: Excell Seltzer T   Assistants: none  Anesthesia:  General LMA anesthesia  Indications: patient has a recent diagnosis of stage IA invasive ductal carcinoma upper Outer quadrant left breast. After extensive preoperative workup and discussion detailed elsewhere we have elected to proceed with radioactive seed localized left breast lumpectomy and left axillary sentinel lymph node biopsy as her initial surgical treatment. She has previously undergone echo placement of a radioactive seed at the tumor site. In the holding area she underwent injection of 1 mCi of technetium sulfur colloid intradermally around the left nipple.  Seed placement was confirmed in the holding area with the neoprobe.  Procedure Detail:  Patient was brought to the operating room, placed in the supine position on the operating table, and laryngeal mask general anesthesia induced. She received preoperative IV antibiotics. PAS were in place. Under sterile technique after patient timeout I injected 5 mL of dilute methylene blue substantially beneath the left nipple and massaged this for several minutes. Following this the entire left breast and chest, axilla and upper arm were widely sterilely prepped and draped. Patient timeout was again performed and correct procedure verified. The lumpectomy was approached initially. The seed was localized in the upper outer quadrant and I made a curvilinear incision in a skin crease in the lateral upper left breast. Dissection was carried down through the subcutaneous tissue. Short skin and subcutaneous flaps were raised. Using the neoprobe for guidance a generous amount of breast tissue was excised around the seed and the specimen removed. There was a palpable mass that felt  contained within the specimen. The specimen was inked for margins. Specimen mammography showed the seed and marking clip centrally located within the specimen although slightly closer to the superior and lateral margins. I then separately excised superior lateral and deep margins sent for permanent section. Hemostasis was obtained the lumpectomy cavity. The cavity was marked with clips. The breast and subcutaneous tissue was reapproximated with interrupted 3-0 Vicryl. Attention was turned to the sentinel lymph node biopsy. A hot area in the left axilla was localized with the neoprobe and a small transverse incision made. Dissection was carried down through the simultaneous tissue with cautery and the clavipectoral fascia incised. Using the neoprobe for guidance I dissected down onto a small lymph node with blue dye and high counts was completely excised with cautery. Ex vivo it had counts of approximately 350. Still elevated counts in the axilla and dissection just deep to this revealed another small lymph node with no dye had elevated counts that was completely excised with cautery and ex vivo had counts of about 300. At this point background in the axilla was less than 20. There was no palpable adenopathy. Hemostasis was assured and the deepness of cutaneous tissue and axilla were closed with interrupted 3-0 Vicryl. Skin incisions were infiltrated with Marcaine. Skin was closed in both incisions with running septic 5-0 Monocryl and Liquiban. Sponge needle and instrument counts were correct.    Findings: As above  Estimated Blood Loss:  Minimal         Drains: none  Blood Given: none          Specimens: #1 left breast lumpectomy  #2 further superior margin oriented   #3 further lateral margin oriented  #4 further deep margin and oriented  #5 hot blue  axillary sentinel lymph nodes X 2         Complications:  * No complications entered in OR log *         Disposition: PACU - hemodynamically stable.          Condition: stable

## 2015-06-17 NOTE — Transfer of Care (Signed)
Immediate Anesthesia Transfer of Care Note  Patient: Norma Robles  Procedure(s) Performed: Procedure(s): RADIOACTIVE SEED LOCALIZATION LEFT BREAST LUMPECTOMY AND LEFT AXILLARY SENTINEL LYMPH NODE BIOPSY (Left)  Patient Location: PACU  Anesthesia Type:General  Level of Consciousness: awake and sedated  Airway & Oxygen Therapy: Patient Spontanous Breathing and Patient connected to face mask oxygen  Post-op Assessment: Report given to RN and Post -op Vital signs reviewed and stable  Post vital signs: Reviewed and stable  Last Vitals:  Filed Vitals:   06/17/15 0855  BP:   Pulse: 91  Temp:   Resp: 10    Complications: No apparent anesthesia complications

## 2015-06-17 NOTE — Anesthesia Preprocedure Evaluation (Signed)
Anesthesia Evaluation  Patient identified by MRN, date of birth, ID band Patient awake    Reviewed: Allergy & Precautions, NPO status , Patient's Chart, lab work & pertinent test results  Airway Mallampati: II  TM Distance: >3 FB Neck ROM: Full    Dental no notable dental hx.    Pulmonary former smoker,  breath sounds clear to auscultation  Pulmonary exam normal       Cardiovascular negative cardio ROS Normal cardiovascular examRhythm:Regular Rate:Normal     Neuro/Psych negative neurological ROS  negative psych ROS   GI/Hepatic negative GI ROS, Neg liver ROS,   Endo/Other  negative endocrine ROS  Renal/GU negative Renal ROS     Musculoskeletal negative musculoskeletal ROS (+)   Abdominal   Peds  Hematology negative hematology ROS (+)   Anesthesia Other Findings   Reproductive/Obstetrics negative OB ROS                             Anesthesia Physical Anesthesia Plan  ASA: II  Anesthesia Plan: General and Regional   Post-op Pain Management: GA combined w/ Regional for post-op pain   Induction: Intravenous  Airway Management Planned: LMA  Additional Equipment:   Intra-op Plan:   Post-operative Plan: Extubation in OR  Informed Consent: I have reviewed the patients History and Physical, chart, labs and discussed the procedure including the risks, benefits and alternatives for the proposed anesthesia with the patient or authorized representative who has indicated his/her understanding and acceptance.   Dental advisory given  Plan Discussed with: CRNA  Anesthesia Plan Comments:         Anesthesia Quick Evaluation

## 2015-06-18 ENCOUNTER — Encounter (HOSPITAL_BASED_OUTPATIENT_CLINIC_OR_DEPARTMENT_OTHER): Payer: Self-pay | Admitting: General Surgery

## 2015-06-23 MED ORDER — BUPIVACAINE-EPINEPHRINE (PF) 0.5% -1:200000 IJ SOLN
INTRAMUSCULAR | Status: DC | PRN
  Administered 2015-06-17: 30 mL

## 2015-06-23 NOTE — Addendum Note (Signed)
Addendum  created 06/23/15 1337 by Nolon Nations, MD   Modules edited: Anesthesia Blocks and Procedures, Anesthesia Medication Administration, Clinical Notes   Clinical Notes:  File: [id number]

## 2015-06-28 ENCOUNTER — Encounter: Payer: Self-pay | Admitting: Hematology and Oncology

## 2015-06-28 ENCOUNTER — Telehealth: Payer: Self-pay | Admitting: *Deleted

## 2015-06-28 ENCOUNTER — Ambulatory Visit (HOSPITAL_BASED_OUTPATIENT_CLINIC_OR_DEPARTMENT_OTHER): Payer: BLUE CROSS/BLUE SHIELD | Admitting: Hematology and Oncology

## 2015-06-28 VITALS — BP 121/73 | HR 84 | Temp 97.9°F | Resp 18 | Ht 60.0 in | Wt 149.5 lb

## 2015-06-28 DIAGNOSIS — C50412 Malignant neoplasm of upper-outer quadrant of left female breast: Secondary | ICD-10-CM

## 2015-06-28 DIAGNOSIS — Z170 Estrogen receptor positive status [ER+]: Secondary | ICD-10-CM | POA: Diagnosis not present

## 2015-06-28 NOTE — Telephone Encounter (Signed)
Spoke to pt concerning oncotype testing. Discussed need and processing. Received verbal understanding. Denies further needs at this time. Encourage pt to call with further questions or concerns. Oncotype order received per Dr. Lindi Adie. Requisition sent to pathology. Received by Alyse Low.

## 2015-06-28 NOTE — Assessment & Plan Note (Signed)
Left lumpectomy: Invasive ductal carcinoma, grade 3, tumor size 1.6 cm, margins negative posterior 1 mm, 0/2 sentinel nodes, ER 100%, PR 60%, HER-2 negative ratio 0.67, Ki-67 30%, T1 cN0 stage IA  Pathology review: I discussed the final pathology report of the patient provided her with a copy of this report. I discussed the final tumor size as being exactly the same as what we had thought by radiology. The margins are negative and hence there is no need for any additional surgery. I discussed the significance of grade 3 as well as the ER/PR HER-2 receptors. We briefly discussed the final pathologic staging as well.  Recommendations: 1. Oncotype DX testing to determine if chemotherapy would be of any benefit followed by 2. Adjuvant radiation therapy followed by 3. Adjuvant antiestrogen therapy  Return to clinic based on Oncotype DX results.

## 2015-06-28 NOTE — Progress Notes (Signed)
Patient Care Team: [name], MD as PCP - General (Family Medicine) Molli Posey, MD as Consulting Physician (Obstetrics and Gynecology) Excell Seltzer, MD as Consulting Physician (General Surgery) Nicholas Lose, MD as Consulting Physician (Hematology and Oncology) Eppie Gibson, MD as Attending Physician (Radiation Oncology) Mauro Kaufmann, RN as Registered Nurse Rockwell Germany, RN as Registered Nurse Holley Bouche, NP as Nurse Practitioner (Nurse Practitioner) Sylvan Cheese, NP as Nurse Practitioner (Nurse Practitioner)  DIAGNOSIS: Breast cancer of upper-outer quadrant of left female breast   Staging form: Breast, AJCC 7th Edition     Clinical stage from 06/09/2015: Stage IA (T1c, N0, M0) - Unsigned   SUMMARY OF ONCOLOGIC HISTORY:   Breast cancer of upper-outer quadrant of left female breast   06/02/2015 Initial Diagnosis Left breast biopsy 1:30 position: Invasive ductal carcinoma with associated microcalcification, grade 1-2 ER 100%, PR 60%, HER-2 negative, Ki-67 30%, 1.6 cm tumor T1 cN0 M0 stage IA clinical stage   06/17/2015 Surgery Left lumpectomy: Invasive ductal carcinoma, grade 3, tumor size 1.6 cm, margins negative posterior 1 mm, 0/2 sentinel nodes, ER 100%, PR 60%, HER-2 negative ratio 0.67, Ki-67 30%, T1 cN0 stage IA    CHIEF COMPLIANT: Follow-up after surgery  INTERVAL HISTORY: Norma Robles is a 54 year old with above-mentioned history of left breast cancer underwent lumpectomy and is here today to discuss the results. She reports to be doing quite well from surgery standpoint with very minimal discomfort. She is also undergoing testing for an abnormal Pap smear. She has scheduled to undergo further evaluation September 8. She has had previous problems with polyps. Even though she is menopausal she had one episode of menstrual bleeding last June 2016. She may need to undergo D&C at some point in the future.  REVIEW OF SYSTEMS:   Constitutional: Denies  fevers, chills or abnormal weight loss Eyes: Denies blurriness of vision Ears, nose, mouth, throat, and face: Denies mucositis or sore throat Respiratory: Denies cough, dyspnea or wheezes Cardiovascular: Denies palpitation, chest discomfort or lower extremity swelling Gastrointestinal:  Denies nausea, heartburn or change in bowel habits Skin: Denies abnormal skin rashes Lymphatics: Denies new lymphadenopathy or easy bruising Neurological:Denies numbness, tingling or new weaknesses Behavioral/Psych: Mood is stable, no new changes  Breast: Mild breast discomfort from recent surgery All other systems were reviewed with the patient and are negative.  I have reviewed the past medical history, past surgical history, social history and family history with the patient and they are unchanged from previous note.  ALLERGIES:  is allergic to codeine.  MEDICATIONS:  Current Outpatient Prescriptions  Medication Sig Dispense Refill  . hydrochlorothiazide (MICROZIDE) 12.5 MG capsule as needed.   0  . Sennosides (EQ LAXATIVE PO) Take by mouth as needed.     No current facility-administered medications for this visit.    PHYSICAL EXAMINATION: ECOG PERFORMANCE STATUS: 1 - Symptomatic but completely ambulatory  Filed Vitals:   06/28/15 0952  BP: 121/73  Pulse: 84  Temp: 97.9 F (36.6 C)  Resp: 18   Filed Weights   06/28/15 0952  Weight: 149 lb 8 oz (67.813 kg)    GENERAL:alert, no distress and comfortable SKIN: skin color, texture, turgor are normal, no rashes or significant lesions EYES: normal, Conjunctiva are pink and non-injected, sclera clear OROPHARYNX:no exudate, no erythema and lips, buccal mucosa, and tongue normal  NECK: supple, thyroid normal size, non-tender, without nodularity LYMPH:  no palpable lymphadenopathy in the cervical, axillary or inguinal LUNGS: clear to auscultation and percussion with  normal breathing effort HEART: regular rate & rhythm and no murmurs and no  lower extremity edema ABDOMEN:abdomen soft, non-tender and normal bowel sounds Musculoskeletal:no cyanosis of digits and no clubbing  NEURO: alert & oriented x 3 with fluent speech, no focal motor/sensory deficits GYN: Abnormal Pap test  LABORATORY DATA:  I have reviewed the data as listed   Chemistry      Component Value Date/Time   NA 142 06/09/2015 1222   NA 140 07/03/2014 2017   K 4.3 06/09/2015 1222   K 4.7 07/03/2014 2017   CL 101 07/03/2014 2017   CO2 27 06/09/2015 1222   CO2 25 07/03/2014 2017   BUN 12.9 06/09/2015 1222   BUN 9 07/03/2014 2017   CREATININE 0.8 06/09/2015 1222   CREATININE 0.70 07/03/2014 2017      Component Value Date/Time   CALCIUM 10.3 06/09/2015 1222   CALCIUM 10.2 07/03/2014 2017   ALKPHOS 127 06/09/2015 1222   ALKPHOS 119* 07/03/2014 2017   AST 17 06/09/2015 1222   AST 16 07/03/2014 2017   ALT 23 06/09/2015 1222   ALT 16 07/03/2014 2017   BILITOT 0.28 06/09/2015 1222   BILITOT 0.3 07/03/2014 2017       Lab Results  Component Value Date   WBC 8.0 06/09/2015   HGB 11.2* 06/09/2015   HCT 35.1 06/09/2015   MCV 74.5* 06/09/2015   PLT 334 06/09/2015   NEUTROABS 5.1 06/09/2015    ASSESSMENT & PLAN:  Breast cancer of upper-outer quadrant of left female breast Left lumpectomy: Invasive ductal carcinoma, grade 3, tumor size 1.6 cm, margins negative posterior 1 mm, 0/2 sentinel nodes, ER 100%, PR 60%, HER-2 negative ratio 0.67, Ki-67 30%, T1 cN0 stage IA  Pathology review: I discussed the final pathology report of the patient provided her with a copy of this report. I discussed the final tumor size as being exactly the same as what we had thought by radiology. The margins are negative and hence there is no need for any additional surgery. I discussed the significance of grade 3 as well as the ER/PR HER-2 receptors. We briefly discussed the final pathologic staging as well.  Recommendations: 1. Oncotype DX testing to determine if chemotherapy  would be of any benefit followed by 2. Adjuvant radiation therapy followed by 3. Adjuvant antiestrogen therapy  Return to clinic based on Oncotype DX results.   No orders of the defined types were placed in this encounter.   The patient has a good understanding of the overall plan. she agrees with it. she will call with any problems that may develop before the next visit here.   Rulon Eisenmenger, MD

## 2015-07-06 ENCOUNTER — Encounter (HOSPITAL_COMMUNITY): Payer: Self-pay

## 2015-07-06 ENCOUNTER — Telehealth: Payer: Self-pay | Admitting: Hematology and Oncology

## 2015-07-06 ENCOUNTER — Other Ambulatory Visit: Payer: Self-pay

## 2015-07-06 NOTE — Telephone Encounter (Signed)
Appointments made and per pof patient is aware °

## 2015-07-07 ENCOUNTER — Encounter: Payer: Self-pay | Admitting: Hematology and Oncology

## 2015-07-07 ENCOUNTER — Ambulatory Visit (HOSPITAL_BASED_OUTPATIENT_CLINIC_OR_DEPARTMENT_OTHER): Payer: BLUE CROSS/BLUE SHIELD | Admitting: Hematology and Oncology

## 2015-07-07 VITALS — BP 129/74 | HR 96 | Temp 98.2°F | Resp 18 | Ht 60.0 in | Wt 153.4 lb

## 2015-07-07 DIAGNOSIS — Z170 Estrogen receptor positive status [ER+]: Secondary | ICD-10-CM

## 2015-07-07 DIAGNOSIS — C50412 Malignant neoplasm of upper-outer quadrant of left female breast: Secondary | ICD-10-CM

## 2015-07-07 NOTE — Assessment & Plan Note (Signed)
Left lumpectomy 06/17/2015: Invasive ductal carcinoma, grade 3, tumor size 1.6 cm, margins negative posterior 1 mm, 0/2 sentinel nodes, ER 100%, PR 60%, HER-2 negative ratio 0.67, Ki-67 30%, T1 cN0 stage IA, Oncotype DX score 25, 16% risk of recurrence with tamoxifen alone  Oncotype DX counseling: I discussed the Oncotype grade BX result in great detail. With a score of 25 she has an intermediate risk of recurrence. I discussed that the absolute benefit of chemotherapy would be around 4-5%. After understanding the risks and benefits of chemotherapy with Taxotere and Cytoxan, patient elected to forego chemotherapy.  Recommendations: 1. Adjuvant radiation therapy 2. Followed by adjuvant anastrozole 1 mg daily 5 years Patient is extremely anxious about antiestrogen therapy because she does not want to feel "old" as a result of antiestrogen treatment. She would like to try anastrozole to see if she will tolerate it.  Return to clinic towards end of radiation for follow

## 2015-07-07 NOTE — Progress Notes (Signed)
Patient Care Team: [name], MD as PCP - General (Family Medicine) Molli Posey, MD as Consulting Physician (Obstetrics and Gynecology) Excell Seltzer, MD as Consulting Physician (General Surgery) Nicholas Lose, MD as Consulting Physician (Hematology and Oncology) Eppie Gibson, MD as Attending Physician (Radiation Oncology) Mauro Kaufmann, RN as Registered Nurse Rockwell Germany, RN as Registered Nurse Holley Bouche, NP as Nurse Practitioner (Nurse Practitioner) Sylvan Cheese, NP as Nurse Practitioner (Nurse Practitioner)  DIAGNOSIS: Breast cancer of upper-outer quadrant of left female breast   Staging form: Breast, AJCC 7th Edition     Clinical stage from 06/09/2015: Stage IA (T1c, N0, M0) - Unsigned   SUMMARY OF ONCOLOGIC HISTORY:   Breast cancer of upper-outer quadrant of left female breast   06/02/2015 Initial Diagnosis Left breast biopsy 1:30 position: Invasive ductal carcinoma with associated microcalcification, grade 1-2 ER 100%, PR 60%, HER-2 negative, Ki-67 30%, 1.6 cm tumor T1 cN0 M0 stage IA clinical stage   06/17/2015 Surgery Left lumpectomy: Invasive ductal carcinoma, grade 3, tumor size 1.6 cm, margins negative posterior 1 mm, 0/2 sentinel nodes, ER 100%, PR 60%, HER-2 negative ratio 0.67, Ki-67 30%, T1 cN0 stage IA, Oncotype DX score 25, 16% risk of recurrence    CHIEF COMPLIANT: Follow-up to discuss Oncotype DX score  INTERVAL HISTORY: Norma Robles is a 54 year old with above-mentioned history of left breast cancer treated with lumpectomy. She had Oncotype DX test and she is here today to discuss the results. She is accompanied by her daughter. She denies any major problems with her breasts and she is healing very well.  REVIEW OF SYSTEMS:   Constitutional: Denies fevers, chills or abnormal weight loss Eyes: Denies blurriness of vision Ears, nose, mouth, throat, and face: Denies mucositis or sore throat Respiratory: Denies cough, dyspnea or  wheezes Cardiovascular: Denies palpitation, chest discomfort or lower extremity swelling Gastrointestinal:  Denies nausea, heartburn or change in bowel habits Skin: Denies abnormal skin rashes Lymphatics: Denies new lymphadenopathy or easy bruising Neurological:Denies numbness, tingling or new weaknesses Behavioral/Psych: Mood is stable, no new changes  Breast:  denies any pain or lumps or nodules in either breasts All other systems were reviewed with the patient and are negative.  I have reviewed the past medical history, past surgical history, social history and family history with the patient and they are unchanged from previous note.  ALLERGIES:  is allergic to codeine.  MEDICATIONS:  Current Outpatient Prescriptions  Medication Sig Dispense Refill  . hydrochlorothiazide (MICROZIDE) 12.5 MG capsule as needed.   0  . Sennosides (EQ LAXATIVE PO) Take by mouth as needed.     No current facility-administered medications for this visit.    PHYSICAL EXAMINATION: ECOG PERFORMANCE STATUS: 1 - Symptomatic but completely ambulatory  Filed Vitals:   07/07/15 1607  BP: 129/74  Pulse: 96  Temp: 98.2 F (36.8 C)  Resp: 18   Filed Weights   07/07/15 1607  Weight: 153 lb 6.4 oz (69.582 kg)    GENERAL:alert, no distress and comfortable SKIN: skin color, texture, turgor are normal, no rashes or significant lesions EYES: normal, Conjunctiva are pink and non-injected, sclera clear OROPHARYNX:no exudate, no erythema and lips, buccal mucosa, and tongue normal  NECK: supple, thyroid normal size, non-tender, without nodularity LYMPH:  no palpable lymphadenopathy in the cervical, axillary or inguinal LUNGS: clear to auscultation and percussion with normal breathing effort HEART: regular rate & rhythm and no murmurs and no lower extremity edema ABDOMEN:abdomen soft, non-tender and normal bowel sounds Musculoskeletal:no  cyanosis of digits and no clubbing  NEURO: alert & oriented x 3 with  fluent speech, no focal motor/sensory deficits  LABORATORY DATA:  I have reviewed the data as listed   Chemistry      Component Value Date/Time   NA 142 06/09/2015 1222   NA 140 07/03/2014 2017   K 4.3 06/09/2015 1222   K 4.7 07/03/2014 2017   CL 101 07/03/2014 2017   CO2 27 06/09/2015 1222   CO2 25 07/03/2014 2017   BUN 12.9 06/09/2015 1222   BUN 9 07/03/2014 2017   CREATININE 0.8 06/09/2015 1222   CREATININE 0.70 07/03/2014 2017      Component Value Date/Time   CALCIUM 10.3 06/09/2015 1222   CALCIUM 10.2 07/03/2014 2017   ALKPHOS 127 06/09/2015 1222   ALKPHOS 119* 07/03/2014 2017   AST 17 06/09/2015 1222   AST 16 07/03/2014 2017   ALT 23 06/09/2015 1222   ALT 16 07/03/2014 2017   BILITOT 0.28 06/09/2015 1222   BILITOT 0.3 07/03/2014 2017       Lab Results  Component Value Date   WBC 8.0 06/09/2015   HGB 11.2* 06/09/2015   HCT 35.1 06/09/2015   MCV 74.5* 06/09/2015   PLT 334 06/09/2015   NEUTROABS 5.1 06/09/2015   ASSESSMENT & PLAN:  Breast cancer of upper-outer quadrant of left female breast Left lumpectomy 06/17/2015: Invasive ductal carcinoma, grade 3, tumor size 1.6 cm, margins negative posterior 1 mm, 0/2 sentinel nodes, ER 100%, PR 60%, HER-2 negative ratio 0.67, Ki-67 30%, T1 cN0 stage IA, Oncotype DX score 25, 16% risk of recurrence with tamoxifen alone  Oncotype DX counseling: I discussed the Oncotype grade BX result in great detail. With a score of 25 she has an intermediate risk of recurrence. I discussed that the absolute benefit of chemotherapy would be around 4-5%. After understanding the risks and benefits of chemotherapy with Taxotere and Cytoxan, patient elected to forego chemotherapy.  Recommendations: 1. Adjuvant radiation therapy 2. Followed by adjuvant anastrozole 1 mg daily 5 years Patient is extremely anxious about antiestrogen therapy because she does not want to feel "old" as a result of antiestrogen treatment. She would like to try  anastrozole to see if she will tolerate it.  Return to clinic towards end of radiation for follow up   No orders of the defined types were placed in this encounter.   The patient has a good understanding of the overall plan. she agrees with it. she will call with any problems that may develop before the next visit here.   Rulon Eisenmenger, MD

## 2015-07-08 ENCOUNTER — Other Ambulatory Visit: Payer: Self-pay | Admitting: Hematology and Oncology

## 2015-07-08 ENCOUNTER — Other Ambulatory Visit: Payer: Self-pay | Admitting: Obstetrics and Gynecology

## 2015-07-08 ENCOUNTER — Other Ambulatory Visit: Payer: Self-pay

## 2015-07-08 DIAGNOSIS — C50412 Malignant neoplasm of upper-outer quadrant of left female breast: Secondary | ICD-10-CM

## 2015-07-09 LAB — CYTOLOGY - PAP

## 2015-07-21 ENCOUNTER — Ambulatory Visit
Admission: RE | Admit: 2015-07-21 | Discharge: 2015-07-21 | Disposition: A | Discharge status: Home / Self Care | Payer: BLUE CROSS/BLUE SHIELD | Source: Ambulatory Visit | Attending: Radiation Oncology | Admitting: Radiation Oncology

## 2015-07-21 ENCOUNTER — Encounter: Payer: Self-pay | Admitting: Radiation Oncology

## 2015-07-21 VITALS — BP 131/53 | HR 72 | Temp 97.9°F | Resp 16 | Wt 153.0 lb

## 2015-07-21 DIAGNOSIS — Z510 Encounter for antineoplastic radiation therapy: Secondary | ICD-10-CM | POA: Diagnosis present

## 2015-07-21 DIAGNOSIS — Z170 Estrogen receptor positive status [ER+]: Secondary | ICD-10-CM | POA: Insufficient documentation

## 2015-07-21 DIAGNOSIS — C50412 Malignant neoplasm of upper-outer quadrant of left female breast: Secondary | ICD-10-CM

## 2015-07-21 DIAGNOSIS — C50912 Malignant neoplasm of unspecified site of left female breast: Secondary | ICD-10-CM

## 2015-07-21 NOTE — Progress Notes (Signed)
Radiation Oncology         (336) [phone] ________________________________  Name: Norma Robles MRN: [id number]  Date: 07/21/2015  DOB: 12/31/1960  SIMULATION AND TREATMENT PLANNING NOTE; special treatment procedure  Outpatient  DIAGNOSIS:     ICD-9-CM ICD-10-CM   1. Breast cancer of upper-outer quadrant of left female breast 174.4 C50.412     NARRATIVE:  The patient was brought to the [place].  Identity was confirmed.  All relevant records and images related to the planned course of therapy were reviewed.  The patient freely provided informed written consent to proceed with treatment after reviewing the details related to the planned course of therapy. The consent form was witnessed and verified by the simulation staff.    Then, the patient was set-up in a stable reproducible supine position for radiation therapy with her ipsilateral arm over her head, and her upper body secured in a custom-made Vac-lok device.  CT images were obtained.  Surface markings were placed.  The CT images were loaded into the planning software.    Special treatment procedure was performed today due to the extra time and effort required by myself to plan and prepare this patient for deep inspiration breath hold technique.  I have determined cardiac sparing to be of benefit to this patient to prevent long term cardiac damage due to radiation of the heart.  Bellows were placed on the patient's abdomen. To facilitate cardiac sparing, the patient was coached by the radiation therapists on breath hold techniques and breathing practice was performed. Practice waveforms were obtained. The patient was then scanned while maintaining breath hold in the treatment position.  This image was then transferred over to the imaging specialist. The imaging specialist then created a fusion of the free breathing and breath hold scans using the chest wall as the stable structure. I personally reviewed the fusion in axial,  coronal and sagittal image planes.  Excellent cardiac sparing was obtained.  I felt the patient is an appropriate candidate for breath hold and the patient will be treated as such.  The image fusion was then reviewed with the patient to reinforce the necessity of reproducible breath hold.  TREATMENT PLANNING NOTE: Treatment planning then occurred.  The radiation prescription was entered and confirmed.     A total of 3 medically necessary complex treatment devices were fabricated and supervised by me: 2 fields with MLCs for custom blocks to protect heart, and lungs;  and, a Vac-lok. I have requested : 3D Simulation  I have requested a DVH of the following structures: lungs, heart, lumpectomy cavity.    The patient will receive 40.05 Gy in 15 fractions to the left breast with 2 tangential fields.   This will  be followed by a boost.  Optical Surface Tracking Plan:  Since intensity modulated radiotherapy (IMRT) and 3D conformal radiation treatment methods are predicated on accurate and precise positioning for treatment, intrafraction motion monitoring is medically necessary to ensure accurate and safe treatment delivery. The ability to quantify intrafraction motion without excessive ionizing radiation dose can only be performed with optical surface tracking. Accordingly, surface imaging offers the opportunity to obtain 3D measurements of patient position throughout IMRT and 3D treatments without excessive radiation exposure. I am ordering optical surface tracking for this patient's upcoming course of radiotherapy.  ________________________________   Reference:  Ursula Alert, J, et al. Surface imaging-based analysis of intrafraction motion for breast radiotherapy patients.Journal of [place], v. 15, n.  6, nov. 2014. ISSN [id number].  Available at: <http://www.jacmp.org/index.php/jacmp/article/view/4957>.    -----------------------------------  Eppie Gibson, MD

## 2015-07-21 NOTE — Progress Notes (Signed)
  Radiation Oncology         [phone]) [phone] ________________________________  Name: Norma Robles MRN: [id number]  Date: 07/21/2015  DOB: 29-Dec-1960  Follow-Up Visit Note  Outpatient  CC: Norma Robles., MD  Nicholas Lose, MD  Diagnosis:      ICD-9-CM ICD-10-CM   1. Breast cancer of upper-outer quadrant of left female breast 174.4 C50.412   Stage I pT1cN0 cM0 left Breast UOQ Invasive Ductal Carcinoma, ER  / PR + / Her2 neg, Grade 3   Narrative:  The patient returns today for follow-up.     Since consultation, she underwent left lumpectomy and sentinel lymph node biopsy preformed on 06/17/15.  The tumor in the left breast was 1.6 cm, grade 3, ER/PR+, HER2-neg, with the closest margin 1 mm posteriorly but negative.  Two sentinel lymph nodes were both negative.  She underwent Oncotype Dx testing with a score of 25 which is intermediate risk.  She has chosen not to receive chemotherapy.            ALLERGIES:  is allergic to codeine.  Meds: Current Outpatient Prescriptions  Medication Sig Dispense Refill  . hydrochlorothiazide (MICROZIDE) 12.5 MG capsule as needed.   0  . Sennosides (EQ LAXATIVE PO) Take by mouth as needed.     No current facility-administered medications for this encounter.    Physical Findings:  weight is 153 lb (69.4 kg). Her oral temperature is 97.9 F (36.6 C). Her blood pressure is 131/53 and her pulse is 72. Her respiration is 16 and oxygen saturation is 99%. .     General: Alert and oriented, in no acute distress HEENT: Head is normocephalic.  Neck: Neck is supple, no palpable cervical or supraclavicular lymphadenopathy. Ext: no arm edema Psychiatric: Judgment and insight are intact. Affect is appropriate. Breast exam: Axillary and lumpectomy scars in left breast have healed nicely with no significant swelling.  Lab Findings: Lab Results  Component Value Date   WBC 8.0 06/09/2015   HGB 11.2* 06/09/2015   HCT 35.1 06/09/2015   MCV 74.5* 06/09/2015   PLT 334 06/09/2015       Radiographic Findings: No results found.  Impression/Plan: We discussed adjuvant radiotherapy today.  I recommend 4 weeks of Radiotherapy to the left breast in order to reduce risk of local recurrence by 2/3.  The risks, benefits and side effects of this treatment were discussed in detail.  She understands that radiotherapy is associated with skin irritation and fatigue in the acute setting. Late effects can include cosmetic changes and rare injury to internal organs.   She is enthusiastic about proceeding with treatment. A consent form has been signed and placed in her chart. Simulation to occur today.   This document serves as a record of services personally performed by Eppie Gibson, MD. It was created on her behalf by Janace Hoard, a trained medical scribe. The creation of this record is based on the scribe's personal observations and the provider's statements to them. This document has been checked and approved by the attending provider.  _____________________________________   Eppie Gibson, MD

## 2015-07-21 NOTE — Progress Notes (Signed)
Location of Breast Cancer: left breast   Histology per Pathology Report:  PROGNOSTIC INDICATORS - ACIS Results: IMMUNOHISTOCHEMICAL AND MORPHOMETRIC ANALYSIS BY THE AUTOMATED CELLULAR IMAGING SYSTEM (ACIS) Estrogen Receptor: 100%, POSITIVE, STRONG STAINING INTENSITY (PERFORMED MANUALLY) Progesterone Receptor: 60%, POSITIVE, STONG STAINING INTENSITY (PERFORMED MANUALLY) Proliferation Marker Ki67: 30% (PERFORMED MANUALLY) REFERENCE RANGE ESTROGEN RECEPTOR NEGATIVE <1% POSITIVE =>1% PROGESTERONE RECEPTOR NEGATIVE <1% POSITIVE =>1% All controls stained appropriately Claudette Laws MD Pathologist, Electronic Signature ( Signed 06/09/2015) FLUORESCENCE IN-SITU HYBRIDIZATION Results: HER2 - NEGATIVE RATIO OF HER2/CEP17 SIGNALS 0.66 AVERAGE HER2 COPY NUMBER PER CELL 1.25 Reference Range: NEGATIVE HER2/CEP17 Ratio <2.0 and average HER2 copy number <4.0 1 of  Receptor Status: ER(+), PR (+), Her2-neu (-)  Did patient present with symptoms (if so, please note symptoms) or was this found on screening mammography?: found on routine mammogram  Past/Anticipated interventions by surgeon, if [facility] breast lumpectomy on 8/18  Past/Anticipated interventions by medical oncology, if any: Chemotherapy foregone chemotherapy for arimidex following radiation therapy   Lymphedema issues, if any:  no   Pain issues, if any:  no   SAFETY ISSUES:  Prior radiation? no  Pacemaker/ICD? no  Possible current pregnancy?no  Is the patient on methotrexate? no  Current Complaints / other details:  54 year old. Female. Works full time in [employer]. Has grown children with grandchildren. FULL ROM of both upper extremities. Completely healed following lumpectomy. Exercises daily. States "i feel good."  BP 131/53 mmHg  Pulse 72  Temp(Src) 97.9 F (36.6 C) (Oral)  Resp 16  Wt 153 lb (69.4 kg)  SpO2 99%  LMP 04/14/2015 Wt Readings from Last 3 Encounters:  07/21/15 153 lb (69.4 kg)   07/07/15 153 lb 6.4 oz (69.582 kg)  06/28/15 149 lb 8 oz (67.813 kg)       Levy Pupa, RN 07/21/2015,10:20 AM

## 2015-07-21 NOTE — Progress Notes (Signed)
See progress note under physician encounter. 

## 2015-07-23 DIAGNOSIS — Z510 Encounter for antineoplastic radiation therapy: Secondary | ICD-10-CM | POA: Diagnosis not present

## 2015-07-28 ENCOUNTER — Ambulatory Visit
Admission: RE | Admit: 2015-07-28 | Discharge: 2015-07-28 | Disposition: A | Discharge status: Home / Self Care | Payer: BLUE CROSS/BLUE SHIELD | Source: Ambulatory Visit | Attending: Radiation Oncology | Admitting: Radiation Oncology

## 2015-07-28 DIAGNOSIS — Z510 Encounter for antineoplastic radiation therapy: Secondary | ICD-10-CM | POA: Diagnosis not present

## 2015-07-29 ENCOUNTER — Ambulatory Visit: Payer: BLUE CROSS/BLUE SHIELD

## 2015-07-30 ENCOUNTER — Ambulatory Visit: Payer: BLUE CROSS/BLUE SHIELD

## 2015-08-02 ENCOUNTER — Encounter: Payer: Self-pay | Admitting: Radiation Oncology

## 2015-08-02 ENCOUNTER — Ambulatory Visit
Admission: RE | Admit: 2015-08-02 | Discharge: 2015-08-02 | Disposition: A | Discharge status: Home / Self Care | Payer: BLUE CROSS/BLUE SHIELD | Source: Ambulatory Visit | Attending: Radiation Oncology | Admitting: Radiation Oncology

## 2015-08-02 VITALS — BP 136/79 | HR 77 | Temp 97.6°F | Ht 60.0 in | Wt 156.2 lb

## 2015-08-02 DIAGNOSIS — Z510 Encounter for antineoplastic radiation therapy: Secondary | ICD-10-CM | POA: Insufficient documentation

## 2015-08-02 DIAGNOSIS — C50412 Malignant neoplasm of upper-outer quadrant of left female breast: Secondary | ICD-10-CM | POA: Diagnosis not present

## 2015-08-02 MED ORDER — ALRA NON-METALLIC DEODORANT (RAD-ONC)
1.0000 "application " | Freq: Once | TOPICAL | Status: AC
  Administered 2015-08-02: 1 via TOPICAL

## 2015-08-02 MED ORDER — RADIAPLEXRX EX GEL
Freq: Once | CUTANEOUS | Status: AC
  Administered 2015-08-02: 18:00:00 via TOPICAL

## 2015-08-02 NOTE — Progress Notes (Signed)
Ms. Corita Allinson is here for her first fraction of radiation to her left breast. She reports she is doing well with no complaints of pain, or fatigue. She was given radiaplex and alra deodorant and instructed on its use.  BP 136/79 mmHg  Pulse 77  Temp(Src) 97.6 F (36.4 C)  Ht 5' (1.524 m)  Wt 156 lb 3.2 oz (70.852 kg)  BMI 30.51 kg/m2  LMP 04/14/2015  Wt Readings from Last 3 Encounters:  08/02/15 156 lb 3.2 oz (70.852 kg)  07/21/15 153 lb (69.4 kg)  07/07/15 153 lb 6.4 oz (69.582 kg)

## 2015-08-02 NOTE — Progress Notes (Signed)
   Weekly Management Note:  Outpatient    ICD-9-CM ICD-10-CM   1. Breast cancer of upper-outer quadrant of left female breast (HCC) 174.4 C50.412 hyaluronate sodium (RADIAPLEXRX) gel     non-metallic deodorant (ALRA) 1 application    Current Dose:  2.67 Gy  Projected Dose: 50.05 Gy   Narrative:  The patient presents for routine under treatment assessment.  CBCT/MVCT images/Port film x-rays were reviewed.  The chart was checked. No complaints  Physical Findings:  height is 5' (1.524 m) and weight is 156 lb 3.2 oz (70.852 kg). Her temperature is 97.6 F (36.4 C). Her blood pressure is 136/79 and her pulse is 77.   Wt Readings from Last 3 Encounters:  08/02/15 156 lb 3.2 oz (70.852 kg)  07/21/15 153 lb (69.4 kg)  07/07/15 153 lb 6.4 oz (69.582 kg)   No skin changes over left breast  Impression:  The patient is tolerating radiotherapy.  Plan:  Continue radiotherapy as planned.    ________________________________   Eppie Gibson, M.D.

## 2015-08-03 ENCOUNTER — Ambulatory Visit
Admission: RE | Admit: 2015-08-03 | Discharge: 2015-08-03 | Disposition: A | Discharge status: Home / Self Care | Payer: BLUE CROSS/BLUE SHIELD | Source: Ambulatory Visit | Attending: Radiation Oncology | Admitting: Radiation Oncology

## 2015-08-03 DIAGNOSIS — Z510 Encounter for antineoplastic radiation therapy: Secondary | ICD-10-CM | POA: Diagnosis not present

## 2015-08-04 ENCOUNTER — Ambulatory Visit
Admission: RE | Admit: 2015-08-04 | Discharge: 2015-08-04 | Disposition: A | Discharge status: Home / Self Care | Payer: BLUE CROSS/BLUE SHIELD | Source: Ambulatory Visit | Attending: Radiation Oncology | Admitting: Radiation Oncology

## 2015-08-04 DIAGNOSIS — Z510 Encounter for antineoplastic radiation therapy: Secondary | ICD-10-CM | POA: Diagnosis not present

## 2015-08-05 ENCOUNTER — Ambulatory Visit
Admission: RE | Admit: 2015-08-05 | Discharge: 2015-08-05 | Disposition: A | Discharge status: Home / Self Care | Payer: BLUE CROSS/BLUE SHIELD | Source: Ambulatory Visit | Attending: Radiation Oncology | Admitting: Radiation Oncology

## 2015-08-05 DIAGNOSIS — Z510 Encounter for antineoplastic radiation therapy: Secondary | ICD-10-CM | POA: Diagnosis not present

## 2015-08-06 ENCOUNTER — Ambulatory Visit
Admission: RE | Admit: 2015-08-06 | Discharge: 2015-08-06 | Disposition: A | Discharge status: Home / Self Care | Payer: BLUE CROSS/BLUE SHIELD | Source: Ambulatory Visit | Attending: Radiation Oncology | Admitting: Radiation Oncology

## 2015-08-06 DIAGNOSIS — Z510 Encounter for antineoplastic radiation therapy: Secondary | ICD-10-CM | POA: Diagnosis not present

## 2015-08-09 ENCOUNTER — Ambulatory Visit
Admission: RE | Admit: 2015-08-09 | Discharge: 2015-08-09 | Disposition: A | Discharge status: Home / Self Care | Payer: BLUE CROSS/BLUE SHIELD | Source: Ambulatory Visit | Attending: Radiation Oncology | Admitting: Radiation Oncology

## 2015-08-09 VITALS — BP 120/60 | Temp 97.9°F | Ht 60.0 in | Wt 151.6 lb

## 2015-08-09 DIAGNOSIS — C50412 Malignant neoplasm of upper-outer quadrant of left female breast: Secondary | ICD-10-CM

## 2015-08-09 DIAGNOSIS — Z510 Encounter for antineoplastic radiation therapy: Secondary | ICD-10-CM | POA: Diagnosis not present

## 2015-08-09 NOTE — Progress Notes (Signed)
Norma Robles presents for her 6th fraction of radiation to her left breast.  She denies pain but does report some mild fatigue, but is still working full-time. Her Left breast is slightly reddened, with a slightly darker color to her anterior breast. She denies any tenderness, or pain to her left breast, and she is using the radiaplex cream twice a day.  BP 120/60 mmHg  Temp(Src) 97.9 F (36.6 C)  Ht 5' (1.524 m)  Wt 151 lb 9.6 oz (68.765 kg)  BMI 29.61 kg/m2  LMP 04/14/2015  Wt Readings from Last 3 Encounters:  08/09/15 151 lb 9.6 oz (68.765 kg)  08/02/15 156 lb 3.2 oz (70.852 kg)  07/21/15 153 lb (69.4 kg)

## 2015-08-09 NOTE — Progress Notes (Signed)
   Weekly Management Note:  Outpatient    ICD-9-CM ICD-10-CM   1. Breast cancer of upper-outer quadrant of left female breast (HCC) 174.4 C50.412     Current Dose:  16.02Gy  Projected Dose: 50.05 Gy   Narrative:  The patient presents for routine under treatment assessment.  CBCT/MVCT images/Port film x-rays were reviewed.  The chart was checked. No complaints  Physical Findings:  height is 5' (1.524 m) and weight is 151 lb 9.6 oz (68.765 kg). Her temperature is 97.9 F (36.6 C). Her blood pressure is 120/60.   Wt Readings from Last 3 Encounters:  08/09/15 151 lb 9.6 oz (68.765 kg)  08/02/15 156 lb 3.2 oz (70.852 kg)  07/21/15 153 lb (69.4 kg)   Mild hyperpigmentation/ erythema  over left breast  Impression:  The patient is tolerating radiotherapy.  Plan:  Continue radiotherapy as planned.    ________________________________   Eppie Gibson, M.D.

## 2015-08-10 ENCOUNTER — Ambulatory Visit
Admission: RE | Admit: 2015-08-10 | Discharge: 2015-08-10 | Disposition: A | Discharge status: Home / Self Care | Payer: BLUE CROSS/BLUE SHIELD | Source: Ambulatory Visit | Attending: Radiation Oncology | Admitting: Radiation Oncology

## 2015-08-10 DIAGNOSIS — Z510 Encounter for antineoplastic radiation therapy: Secondary | ICD-10-CM | POA: Diagnosis not present

## 2015-08-11 ENCOUNTER — Ambulatory Visit
Admission: RE | Admit: 2015-08-11 | Discharge: 2015-08-11 | Disposition: A | Discharge status: Home / Self Care | Payer: BLUE CROSS/BLUE SHIELD | Source: Ambulatory Visit | Attending: Radiation Oncology | Admitting: Radiation Oncology

## 2015-08-11 DIAGNOSIS — Z510 Encounter for antineoplastic radiation therapy: Secondary | ICD-10-CM | POA: Diagnosis not present

## 2015-08-12 ENCOUNTER — Ambulatory Visit
Admission: RE | Admit: 2015-08-12 | Discharge: 2015-08-12 | Disposition: A | Discharge status: Home / Self Care | Payer: BLUE CROSS/BLUE SHIELD | Source: Ambulatory Visit | Attending: Radiation Oncology | Admitting: Radiation Oncology

## 2015-08-12 DIAGNOSIS — Z510 Encounter for antineoplastic radiation therapy: Secondary | ICD-10-CM | POA: Diagnosis not present

## 2015-08-13 ENCOUNTER — Ambulatory Visit
Admission: RE | Admit: 2015-08-13 | Discharge: 2015-08-13 | Disposition: A | Discharge status: Home / Self Care | Payer: BLUE CROSS/BLUE SHIELD | Source: Ambulatory Visit | Attending: Radiation Oncology | Admitting: Radiation Oncology

## 2015-08-13 ENCOUNTER — Encounter: Payer: Self-pay | Admitting: Radiation Oncology

## 2015-08-13 DIAGNOSIS — Z510 Encounter for antineoplastic radiation therapy: Secondary | ICD-10-CM | POA: Diagnosis not present

## 2015-08-16 ENCOUNTER — Ambulatory Visit
Admission: RE | Admit: 2015-08-16 | Discharge: 2015-08-16 | Disposition: A | Discharge status: Home / Self Care | Payer: BLUE CROSS/BLUE SHIELD | Source: Ambulatory Visit | Attending: Radiation Oncology | Admitting: Radiation Oncology

## 2015-08-16 ENCOUNTER — Other Ambulatory Visit: Payer: Self-pay | Admitting: *Deleted

## 2015-08-16 ENCOUNTER — Encounter: Payer: Self-pay | Admitting: Radiation Oncology

## 2015-08-16 ENCOUNTER — Ambulatory Visit: Payer: BLUE CROSS/BLUE SHIELD | Admitting: Radiation Oncology

## 2015-08-16 VITALS — BP 125/83 | HR 72 | Temp 98.1°F | Ht 60.0 in | Wt 154.3 lb

## 2015-08-16 DIAGNOSIS — C50412 Malignant neoplasm of upper-outer quadrant of left female breast: Secondary | ICD-10-CM

## 2015-08-16 DIAGNOSIS — Z510 Encounter for antineoplastic radiation therapy: Secondary | ICD-10-CM | POA: Diagnosis not present

## 2015-08-16 NOTE — Progress Notes (Signed)
   Weekly Management Note:  Outpatient    ICD-9-CM ICD-10-CM   1. Breast cancer of upper-outer quadrant of left female breast (HCC) 174.4 C50.412     Current Dose:  29.37 Gy  Projected Dose: 50.05 Gy   Narrative:  The patient presents for routine under treatment assessment.  CBCT/MVCT images/Port film x-rays were reviewed.  The chart was checked. Noted mild erythema over left breast.  Breast has been somewhat swollen since surgery  Physical Findings:  height is 5' (1.524 m) and weight is 154 lb 4.8 oz (69.99 kg). Her temperature is 98.1 F (36.7 C). Her blood pressure is 125/83 and her pulse is 72.   Wt Readings from Last 3 Encounters:  08/16/15 154 lb 4.8 oz (69.99 kg)  08/09/15 151 lb 9.6 oz (68.765 kg)  08/02/15 156 lb 3.2 oz (70.852 kg)   Modest swelling/ erythema  of left breast  Impression:  The patient is tolerating radiotherapy.  Plan:  Continue radiotherapy as planned.    ________________________________   Eppie Gibson, M.D.

## 2015-08-16 NOTE — Progress Notes (Signed)
Norma Robles was here for her 11th fraction of radiation to her breast. She denies any issues at this time. She was already examined by Dr. Isidore Moos today.   BP 125/83 mmHg  Pulse 72  Temp(Src) 98.1 F (36.7 C)  Ht 5' (1.524 m)  Wt 154 lb 4.8 oz (69.99 kg)  BMI 30.13 kg/m2  LMP 04/14/2015   Wt Readings from Last 3 Encounters:  08/16/15 154 lb 4.8 oz (69.99 kg)  08/09/15 151 lb 9.6 oz (68.765 kg)  08/02/15 156 lb 3.2 oz (70.852 kg)

## 2015-08-16 NOTE — Progress Notes (Signed)
Called pt to assess needs during xrt. Relate doing well and without complaints. Scheduled and confirmed f/u with Dr. Lindi Adie on 10/31 at 0900. Discussed survivorship program and referral. Pt relate she wishes to receive survivorship care plan in the mail. Encourage pt to call with further questions. Received verbal understanding.

## 2015-08-17 ENCOUNTER — Ambulatory Visit
Admission: RE | Admit: 2015-08-17 | Discharge: 2015-08-17 | Disposition: A | Discharge status: Home / Self Care | Payer: BLUE CROSS/BLUE SHIELD | Source: Ambulatory Visit | Attending: Radiation Oncology | Admitting: Radiation Oncology

## 2015-08-17 DIAGNOSIS — Z510 Encounter for antineoplastic radiation therapy: Secondary | ICD-10-CM | POA: Diagnosis not present

## 2015-08-18 ENCOUNTER — Ambulatory Visit
Admission: RE | Admit: 2015-08-18 | Discharge: 2015-08-18 | Disposition: A | Discharge status: Home / Self Care | Payer: BLUE CROSS/BLUE SHIELD | Source: Ambulatory Visit | Attending: Radiation Oncology | Admitting: Radiation Oncology

## 2015-08-18 DIAGNOSIS — Z510 Encounter for antineoplastic radiation therapy: Secondary | ICD-10-CM | POA: Diagnosis not present

## 2015-08-19 ENCOUNTER — Ambulatory Visit
Admission: RE | Admit: 2015-08-19 | Discharge: 2015-08-19 | Disposition: A | Discharge status: Home / Self Care | Payer: BLUE CROSS/BLUE SHIELD | Source: Ambulatory Visit | Attending: Radiation Oncology | Admitting: Radiation Oncology

## 2015-08-19 DIAGNOSIS — Z510 Encounter for antineoplastic radiation therapy: Secondary | ICD-10-CM | POA: Diagnosis not present

## 2015-08-20 ENCOUNTER — Ambulatory Visit
Admission: RE | Admit: 2015-08-20 | Discharge: 2015-08-20 | Disposition: A | Discharge status: Home / Self Care | Payer: BLUE CROSS/BLUE SHIELD | Source: Ambulatory Visit | Attending: Radiation Oncology | Admitting: Radiation Oncology

## 2015-08-20 DIAGNOSIS — Z510 Encounter for antineoplastic radiation therapy: Secondary | ICD-10-CM | POA: Diagnosis not present

## 2015-08-23 ENCOUNTER — Encounter: Payer: Self-pay | Admitting: Radiation Oncology

## 2015-08-23 ENCOUNTER — Ambulatory Visit
Admission: RE | Admit: 2015-08-23 | Discharge: 2015-08-23 | Disposition: A | Discharge status: Home / Self Care | Payer: BLUE CROSS/BLUE SHIELD | Source: Ambulatory Visit | Attending: Radiation Oncology | Admitting: Radiation Oncology

## 2015-08-23 VITALS — BP 115/67 | HR 65 | Temp 98.1°F | Ht 60.0 in | Wt 153.0 lb

## 2015-08-23 DIAGNOSIS — Z510 Encounter for antineoplastic radiation therapy: Secondary | ICD-10-CM | POA: Diagnosis not present

## 2015-08-23 DIAGNOSIS — C50412 Malignant neoplasm of upper-outer quadrant of left female breast: Secondary | ICD-10-CM

## 2015-08-23 NOTE — Progress Notes (Signed)
   Weekly Management Note:  Outpatient    ICD-9-CM ICD-10-CM   1. Breast cancer of upper-outer quadrant of left female breast (HCC) 174.4 C50.412     Current Dose:  42.05 Gy  Projected Dose: 50.05 Gy   Narrative:  The patient presents for routine under treatment assessment.  CBCT/MVCT images/Port film x-rays were reviewed.  The chart was checked . left breast more itchy, erythematous  Physical Findings:  height is 5' (1.524 m) and weight is 153 lb (69.4 kg). Her temperature is 98.1 F (36.7 C). Her blood pressure is 115/67 and her pulse is 65.   Wt Readings from Last 3 Encounters:  08/23/15 153 lb (69.4 kg)  08/16/15 154 lb 4.8 oz (69.99 kg)  08/09/15 151 lb 9.6 oz (68.765 kg)   Modest swelling/ erythema  of left breast  Skin intact  Impression:  The patient is tolerating radiotherapy.  Plan:  Continue radiotherapy as planned.  hydrocortisone 1% cream in itchy areas.  Neosporin if skin peels.  followup card given for 1 month post tx visit.  ________________________________   Eppie Gibson, M.D.

## 2015-08-23 NOTE — Progress Notes (Signed)
Ms. Telleria is here for her 16/20 fraction of radiation to her Left Breast.  She reports little fatigue. She is going to the gym two times a week, and walking on the weekends. Her skin is red to her left breast, and she has skin irritation underneath her breast, which she reports is very itchy. She is using hydrocortizone cream in the area under her breast, and radiaplex to her anterior breast as directed.   BP 115/67 mmHg  Pulse 65  Temp(Src) 98.1 F (36.7 C)  Ht 5' (1.524 m)  Wt 153 lb (69.4 kg)  BMI 29.88 kg/m2  LMP 04/14/2015   Wt Readings from Last 3 Encounters:  08/23/15 153 lb (69.4 kg)  08/16/15 154 lb 4.8 oz (69.99 kg)  08/09/15 151 lb 9.6 oz (68.765 kg)

## 2015-08-24 ENCOUNTER — Ambulatory Visit
Admission: RE | Admit: 2015-08-24 | Discharge: 2015-08-24 | Disposition: A | Discharge status: Home / Self Care | Payer: BLUE CROSS/BLUE SHIELD | Source: Ambulatory Visit | Attending: Radiation Oncology | Admitting: Radiation Oncology

## 2015-08-24 DIAGNOSIS — Z510 Encounter for antineoplastic radiation therapy: Secondary | ICD-10-CM | POA: Diagnosis not present

## 2015-08-25 ENCOUNTER — Ambulatory Visit: Payer: BLUE CROSS/BLUE SHIELD

## 2015-08-25 ENCOUNTER — Ambulatory Visit
Admission: RE | Admit: 2015-08-25 | Discharge: 2015-08-25 | Disposition: A | Discharge status: Home / Self Care | Payer: BLUE CROSS/BLUE SHIELD | Source: Ambulatory Visit | Attending: Radiation Oncology | Admitting: Radiation Oncology

## 2015-08-25 DIAGNOSIS — Z510 Encounter for antineoplastic radiation therapy: Secondary | ICD-10-CM | POA: Diagnosis not present

## 2015-08-26 ENCOUNTER — Ambulatory Visit
Admission: RE | Admit: 2015-08-26 | Discharge: 2015-08-26 | Disposition: A | Discharge status: Home / Self Care | Payer: BLUE CROSS/BLUE SHIELD | Source: Ambulatory Visit | Attending: Radiation Oncology | Admitting: Radiation Oncology

## 2015-08-26 DIAGNOSIS — Z510 Encounter for antineoplastic radiation therapy: Secondary | ICD-10-CM | POA: Diagnosis not present

## 2015-08-27 ENCOUNTER — Encounter: Payer: Self-pay | Admitting: Radiation Oncology

## 2015-08-27 ENCOUNTER — Other Ambulatory Visit: Payer: Self-pay | Admitting: Adult Health

## 2015-08-27 ENCOUNTER — Telehealth: Payer: Self-pay | Admitting: *Deleted

## 2015-08-27 ENCOUNTER — Ambulatory Visit
Admission: RE | Admit: 2015-08-27 | Discharge: 2015-08-27 | Disposition: A | Discharge status: Home / Self Care | Payer: BLUE CROSS/BLUE SHIELD | Source: Ambulatory Visit | Attending: Radiation Oncology | Admitting: Radiation Oncology

## 2015-08-27 DIAGNOSIS — Z510 Encounter for antineoplastic radiation therapy: Secondary | ICD-10-CM | POA: Diagnosis not present

## 2015-08-27 DIAGNOSIS — C50412 Malignant neoplasm of upper-outer quadrant of left female breast: Secondary | ICD-10-CM

## 2015-08-27 NOTE — Telephone Encounter (Signed)
Called pt to congratulate on completion of xrt. Relate doing well and without complaints. Discussed survivorship program and referral. Pt wish for care plan to be sent. Encourage pt to call with needs or questions. Received verbal understanding.

## 2015-08-29 NOTE — Assessment & Plan Note (Addendum)
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 Adjuvant radiation therapy 2. Followed by adjuvant anastrozole 1 mg daily 5 years Patient is extremely anxious about antiestrogen therapy because she does not want to feel "old" as a result of antiestrogen treatment. She would like to try anastrozole to see if she will tolerate it.  F/U in 1 month to assess toxicities to anastrozole

## 2015-08-30 ENCOUNTER — Ambulatory Visit (HOSPITAL_BASED_OUTPATIENT_CLINIC_OR_DEPARTMENT_OTHER): Payer: BLUE CROSS/BLUE SHIELD | Admitting: Hematology and Oncology

## 2015-08-30 ENCOUNTER — Telehealth: Payer: Self-pay | Admitting: Hematology and Oncology

## 2015-08-30 ENCOUNTER — Encounter: Payer: Self-pay | Admitting: Hematology and Oncology

## 2015-08-30 VITALS — BP 109/69 | HR 70 | Temp 97.5°F | Resp 18 | Ht 60.0 in | Wt 155.7 lb

## 2015-08-30 DIAGNOSIS — Z79811 Long term (current) use of aromatase inhibitors: Secondary | ICD-10-CM

## 2015-08-30 DIAGNOSIS — C50412 Malignant neoplasm of upper-outer quadrant of left female breast: Secondary | ICD-10-CM | POA: Diagnosis not present

## 2015-08-30 MED ORDER — ANASTROZOLE 1 MG PO TABS: 1.0000 mg | ORAL_TABLET | Freq: Every day | ORAL | Status: DC

## 2015-08-30 NOTE — Progress Notes (Signed)
Patient Care Team: [name], MD as PCP - General (Family Medicine) Molli Posey, MD as Consulting Physician (Obstetrics and Gynecology) Excell Seltzer, MD as Consulting Physician (General Surgery) Nicholas Lose, MD as Consulting Physician (Hematology and Oncology) Eppie Gibson, MD as Attending Physician (Radiation Oncology) Mauro Kaufmann, RN as Registered Nurse Rockwell Germany, RN as Registered Nurse Holley Bouche, NP as Nurse Practitioner (Nurse Practitioner) Sylvan Cheese, NP as Nurse Practitioner (Nurse Practitioner)  DIAGNOSIS: Breast cancer of upper-outer quadrant of left female breast [facility])   Staging form: Breast, AJCC 7th Edition     Clinical stage from 06/09/2015: Stage IA (T1c, N0, M0) - Unsigned   SUMMARY OF ONCOLOGIC HISTORY:   Breast cancer of upper-outer quadrant of left female breast ([place])   06/02/2015 Initial Diagnosis Left breast biopsy 1:30 position: Invasive ductal carcinoma with associated microcalcification, grade 1-2 ER 100%, PR 60%, HER-2 negative, Ki-67 30%, 1.6 cm tumor T1 cN0 M0 stage IA clinical stage   06/17/2015 Surgery Left lumpectomy: Invasive ductal carcinoma, grade 3, 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 She completed radiation therapy and is here to discuss starting antiestrogen therapy. She is very concerned about antiestrogen therapy related side effects. She is healing very well from the effects of radiation therapy.  REVIEW OF SYSTEMS:   Constitutional: Denies fevers, chills  or abnormal weight loss Eyes: Denies blurriness of vision Ears, nose, mouth, throat, and face: Denies mucositis or sore throat Respiratory: Denies cough, dyspnea or wheezes Cardiovascular: Denies palpitation, chest discomfort or lower extremity swelling Gastrointestinal:  Denies nausea, heartburn or change in bowel habits Skin: Denies abnormal skin rashes Lymphatics: Denies new lymphadenopathy or easy bruising Neurological:Denies numbness, tingling or new weaknesses Behavioral/Psych: Mood is stable, no new changes  Breast: Fullness in the left breast and mild soreness from the effects of radiation All other systems were reviewed with the patient and are negative.  I have reviewed the past medical history, past surgical history, social history and family history with the patient and they are unchanged from previous note.  ALLERGIES:  is allergic to codeine.  MEDICATIONS:  Current Outpatient Prescriptions  Medication Sig Dispense Refill  . anastrozole (ARIMIDEX) 1 MG tablet Take 1 tablet (1 mg total) by mouth daily. 90 tablet 3  . hydrochlorothiazide (MICROZIDE) 12.5 MG capsule as needed.   0  . Sennosides (EQ LAXATIVE PO) Take by mouth as needed.     No current facility-administered medications for this visit.    PHYSICAL EXAMINATION: ECOG PERFORMANCE STATUS: 1 - Symptomatic but completely ambulatory  Filed Vitals:   08/30/15 0917  BP: 109/69  Pulse: 70  Temp: 97.5 F (36.4 C)  Resp: 18   Filed Weights   08/30/15 0917  Weight: 155 lb 11.2 oz (70.625 kg)    GENERAL:alert, no distress and comfortable SKIN: skin color, texture, turgor are normal, no rashes or significant lesions EYES: normal, Conjunctiva are pink and non-injected, sclera clear OROPHARYNX:no exudate, no erythema and lips, buccal mucosa, and tongue normal  NECK: supple, thyroid normal size, non-tender, without nodularity  LYMPH:  no palpable lymphadenopathy in the cervical, axillary or inguinal LUNGS: clear  to auscultation and percussion with normal breathing effort HEART: regular rate & rhythm and no murmurs and no lower extremity edema ABDOMEN:abdomen soft, non-tender and normal bowel sounds Musculoskeletal:no cyanosis of digits and no clubbing  NEURO: alert & oriented x 3 with fluent speech, no focal motor/sensory deficits   LABORATORY DATA:  I have reviewed the data as listed   Chemistry      Component Value Date/Time   NA 142 06/09/2015 1222   NA 140 07/03/2014 2017   K 4.3 06/09/2015 1222   K 4.7 07/03/2014 2017   CL 101 07/03/2014 2017   CO2 27 06/09/2015 1222   CO2 25 07/03/2014 2017   BUN 12.9 06/09/2015 1222   BUN 9 07/03/2014 2017   CREATININE 0.8 06/09/2015 1222   CREATININE 0.70 07/03/2014 2017      Component Value Date/Time   CALCIUM 10.3 06/09/2015 1222   CALCIUM 10.2 07/03/2014 2017   ALKPHOS 127 06/09/2015 1222   ALKPHOS 119* 07/03/2014 2017   AST 17 06/09/2015 1222   AST 16 07/03/2014 2017   ALT 23 06/09/2015 1222   ALT 16 07/03/2014 2017   BILITOT 0.28 06/09/2015 1222   BILITOT 0.3 07/03/2014 2017       Lab Results  Component Value Date   WBC 8.0 06/09/2015   HGB 11.2* 06/09/2015   HCT 35.1 06/09/2015   MCV 74.5* 06/09/2015   PLT 334 06/09/2015   NEUTROABS 5.1 06/09/2015   ASSESSMENT & PLAN:  Breast cancer of upper-outer quadrant of left female breast Left lumpectomy 06/17/2015: Invasive ductal carcinoma, grade 3, 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  Recommendations: 1. Adjuvant radiation therapy completed 08/27/2015 2. Followed by adjuvant anastrozole 1 mg daily 5 years Patient is extremely anxious about antiestrogen therapy because she does not want to feel "old" as a result of antiestrogen  treatment. She would like to try anastrozole to see if she will tolerate it. She will stop it after Thanksgiving.  I discussed the risks and benefits of anastrozole. I encouraged her to take calcium and vitamin D. But she reported that calcium makes her constipated. She would rather eat yogurt and cheese and milk. I encouraged her to take vitamin D and stent alone. She will need a bone density every other year. She tells me she gets up with her primary care physician.  F/U in 1 month to assess toxicities to anastrozole in January 2017.  No orders of the defined types were placed in this encounter.   The patient has a good understanding of the overall plan. she agrees with it. she will call with any problems that may develop before the next visit here.   Rulon Eisenmenger, MD 08/30/2015

## 2015-08-30 NOTE — Telephone Encounter (Signed)
Called and left a message with a new appointment °

## 2015-08-30 NOTE — Addendum Note (Signed)
Addended by: Prentiss Bells on: 08/30/2015 12:45 PM   Modules accepted: Medications

## 2015-09-17 NOTE — Progress Notes (Signed)
  Radiation Oncology         [phone] ________________________________  Name: Norma Robles MRN: [id number]  Date: 08/27/2015  DOB: Jul 12, 1961  End of Treatment Note  Diagnosis: Stage I pT1cN0 cM0 Left Breast UOQ Invasive Ductal Carcinoma, ER +  / PR + / Her2 neg, Grade 3  Indication for treatment: Curative  Radiation treatment dates:   08/02/2015-08/27/2015  Site/dose: 1. Left Breast. 40.05 Gy in 15 fractions.         2. Left Breast Boost. 10 Gy in 5 fractions.  Beams/energy: 1. 3D-Breath Hold // 10X,6X       2. 3D-Breath Hold // 10X,6X  Narrative: The patient tolerated radiation treatment relatively well.  Plan: The patient has completed radiation treatment. The patient will return to radiation oncology clinic for routine followup in one month. I advised them to call or return sooner if they have any questions or concerns related to their recovery or treatment.  -----------------------------------   This document serves as a record of services personally performed by Eppie Gibson, MD. It was created on her behalf by Darcus Austin, a trained medical scribe. The creation of this record is based on the scribe's personal observations and the provider's statements to them. This document has been checked and approved by the attending provider.     Eppie Gibson, MD

## 2015-09-22 NOTE — Progress Notes (Signed)
Photon Boost Complex Simulation and Treatment Planning Note 08-13-15  Diagnosis: Breast Cancer  The patient's CT images from her free-breathing simulation were reviewed to plan her boost treatment to her Left breast  lumpectomy cavity.  The boost to the lumpectomy cavity will be delivered with 2 photon fields using MLCs for custom blocks again heart and lungs, with 6 and 10 MV photon energy.  This constitutes 2 complex treatment devices. Isodose plan was reviewed and approved. 10 Gy in 5 fractions prescribed.  -----------------------------------  Eppie Gibson, MD

## 2015-09-27 ENCOUNTER — Encounter: Payer: Self-pay | Admitting: Nurse Practitioner

## 2015-09-27 DIAGNOSIS — C50412 Malignant neoplasm of upper-outer quadrant of left female breast: Secondary | ICD-10-CM

## 2015-09-27 NOTE — Progress Notes (Signed)
The Survivorship Care Plan was mailed to Norma Robles as she reported not being able to come in to the Survivorship Clinic for an in-person visit at this time. A letter was mailed to her outlining the purpose of the content of the care plan, as well as encouraging her to reach out to me with any questions or concerns.  My business card was included in the correspondence to the patient as well.  A copy of the care plan was also routed/faxed/mailed to Norma Robles., MD, the patient's PCP.  I will not be placing any follow-up appointments to the Survivorship Clinic for Norma Robles, but I am happy to see her at any time in the future for any survivorship concerns that may arise. Thank you for allowing me to participate in her care!  Kenn File, [place] [phone]

## 2015-10-01 ENCOUNTER — Inpatient Hospital Stay
Admission: RE | Admit: 2015-10-01 | Payer: BLUE CROSS/BLUE SHIELD | Source: Ambulatory Visit | Admitting: Radiation Oncology

## 2015-10-01 ENCOUNTER — Ambulatory Visit
Admission: RE | Admit: 2015-10-01 | Payer: BLUE CROSS/BLUE SHIELD | Source: Ambulatory Visit | Admitting: Radiation Oncology

## 2015-10-01 ENCOUNTER — Ambulatory Visit: Payer: BLUE CROSS/BLUE SHIELD | Admitting: Radiation Oncology

## 2015-10-01 ENCOUNTER — Ambulatory Visit
Admission: RE | Admit: 2015-10-01 | Discharge: 2015-10-01 | Disposition: A | Discharge status: Home / Self Care | Payer: BLUE CROSS/BLUE SHIELD | Source: Ambulatory Visit | Attending: Radiation Oncology | Admitting: Radiation Oncology

## 2015-11-29 NOTE — Assessment & Plan Note (Signed)
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  Recommendations: Adjuvant anastrozole 1 mg daily 5 years started 09/27/15  Anastrozole Toxicities:  Patient is extremely anxious about antiestrogen therapy because she does not want to feel "old" as a result of antiestrogen treatment. She would like to try anastrozole to see if she will tolerate it.  F/U in 6 months to assess toxicities to anastrozole in January 2017.  

## 2015-11-30 ENCOUNTER — Encounter: Payer: Self-pay | Admitting: Hematology and Oncology

## 2015-11-30 ENCOUNTER — Telehealth: Payer: Self-pay | Admitting: Hematology and Oncology

## 2015-11-30 ENCOUNTER — Ambulatory Visit (HOSPITAL_BASED_OUTPATIENT_CLINIC_OR_DEPARTMENT_OTHER): Payer: BLUE CROSS/BLUE SHIELD | Admitting: Hematology and Oncology

## 2015-11-30 VITALS — BP 129/82 | HR 76 | Temp 97.9°F | Resp 18 | Ht 60.0 in | Wt 157.2 lb

## 2015-11-30 DIAGNOSIS — C50412 Malignant neoplasm of upper-outer quadrant of left female breast: Secondary | ICD-10-CM | POA: Diagnosis not present

## 2015-11-30 DIAGNOSIS — Z79811 Long term (current) use of aromatase inhibitors: Secondary | ICD-10-CM

## 2015-11-30 NOTE — Progress Notes (Signed)
Unable to get in to exam room prior to MD.  No assessment performed.  

## 2015-11-30 NOTE — Progress Notes (Signed)
Patient Care Team: [name], MD as PCP - General (Family Medicine) Molli Posey, MD as Consulting Physician (Obstetrics and Gynecology) Excell Seltzer, MD as Consulting Physician (General Surgery) Nicholas Lose, MD as Consulting Physician (Hematology and Oncology) Eppie Gibson, MD as Attending Physician (Radiation Oncology) Mauro Kaufmann, RN as Registered Nurse Rockwell Germany, RN as Registered Nurse Holley Bouche, NP as Nurse Practitioner (Nurse Practitioner) Sylvan Cheese, NP as Nurse Practitioner (Nurse Practitioner)  DIAGNOSIS: Breast cancer of upper-outer quadrant of left female breast [facility])   Staging form: Breast, AJCC 7th Edition     Clinical stage from 06/09/2015: Stage IA (T1c, N0, M0) - Unsigned     Pathologic stage from 06/17/2015: Stage IA (T1c, N0, cM0) - Unsigned   SUMMARY OF ONCOLOGIC HISTORY:   Breast cancer of upper-outer quadrant of left female breast ([place])   06/02/2015 Breast US Left breast: hypoechoic mass at 130, 7 cm from the nipple measuring approximately 1.6 x 0.9 x 1.4 cm. There is a border along the superior margin which appears indistinct.    06/02/2015 Initial Biopsy Left breast biopsy 1:30 position: Invasive ductal carcinoma with associated microcalcification, grade 1-2, ER+ (100%), PR+ (60%), HER-2 negative, Ki-67 30%, 1.6 cm tumor    06/03/2015 Clinical Stage Stage IA: T1c N0   06/17/2015 Definitive Surgery Left lumpectomy/SLNB ([place]): Invasive ductal carcinoma, grade 3, tumor size 1.6 cm, HER2/neu repeated and remains negative (ratio 0.67),  margins negative posterior 1 mm, 0/2 sentinel nodes   06/17/2015 Pathologic Stage Stage IA: T1c N0   06/17/2015 Oncotype testing Score 25 (16% ROR)   08/02/2015 - 08/27/2015 Radiation Therapy Adjuvant XRT Isidore Moos): Left Breast. 40.05 Gy in 15 fractions. Left Breast Boost. 10 Gy in 5 fractions.  Total dose: 50 Gy   08/30/2015 -  Anti-estrogen oral therapy Anastrozole 1 mg daily. Planned duration of  therapy 5 years.   09/27/2015 Survivorship Survivorship care plan completed and mailed to patient in lieu of in person visit    CHIEF COMPLIANT: Follow-up on anastrozole  INTERVAL HISTORY: Norma Robles is a 55 year old with above-mentioned history of left breast cancer was currently anastrozole therapy. She feels hot flashes and occasional muscle aches. She is also worried about weight gain. She was recently engaged. She complains that she does not have enough energy or sexual appetite. She does feel guilty about it.  REVIEW OF SYSTEMS:   Constitutional: Denies fevers, chills or abnormal weight loss Eyes: Denies blurriness of vision Ears, nose, mouth, throat, and face: Denies mucositis or sore throat Respiratory: Denies cough, dyspnea or wheezes Cardiovascular: Denies palpitation, chest discomfort Gastrointestinal:  Denies nausea, heartburn or change in bowel habits Skin: Denies abnormal skin rashes Lymphatics: Denies new lymphadenopathy or easy bruising Neurological:Denies numbness, tingling or new weaknesses Behavioral/Psych: Mood is stable, no new changes  Extremities: No lower extremity edema Breast:  denies any pain or lumps or nodules in either breasts All other systems were reviewed with the patient and are negative.  I have reviewed the past medical history, past surgical history, social history and family history with the patient and they are unchanged from previous note.  ALLERGIES:  is allergic to codeine.  MEDICATIONS:  Current Outpatient Prescriptions  Medication Sig Dispense Refill  . ALPRAZolam (XANAX) 0.25 MG tablet Take 0.25 mg by mouth.    Marland Kitchen anastrozole (ARIMIDEX) 1 MG tablet Take 1 tablet (1 mg total) by mouth daily. 90 tablet 3  . hydrochlorothiazide (MICROZIDE) 12.5 MG capsule as needed.   0  .  Sennosides (EQ LAXATIVE PO) Take by mouth as needed.     No current facility-administered medications for this visit.    PHYSICAL EXAMINATION: ECOG PERFORMANCE  STATUS: 1 - Symptomatic but completely ambulatory  Filed Vitals:   11/30/15 0910  BP: 129/82  Pulse: 76  Temp: 97.9 F (36.6 C)  Resp: 18   Filed Weights   11/30/15 0910  Weight: 157 lb 3.2 oz (71.305 kg)    GENERAL:alert, no distress and comfortable SKIN: skin color, texture, turgor are normal, no rashes or significant lesions EYES: normal, Conjunctiva are pink and non-injected, sclera clear OROPHARYNX:no exudate, no erythema and lips, buccal mucosa, and tongue normal  NECK: supple, thyroid normal size, non-tender, without nodularity LYMPH:  no palpable lymphadenopathy in the cervical, axillary or inguinal LUNGS: clear to auscultation and percussion with normal breathing effort HEART: regular rate & rhythm and no murmurs and no lower extremity edema ABDOMEN:abdomen soft, non-tender and normal bowel sounds MUSCULOSKELETAL:no cyanosis of digits and no clubbing  NEURO: alert & oriented x 3 with fluent speech, no focal motor/sensory deficits EXTREMITIES: No lower extremity edema  LABORATORY DATA:  I have reviewed the data as listed   Chemistry      Component Value Date/Time   NA 142 06/09/2015 1222   NA 140 07/03/2014 2017   K 4.3 06/09/2015 1222   K 4.7 07/03/2014 2017   CL 101 07/03/2014 2017   CO2 27 06/09/2015 1222   CO2 25 07/03/2014 2017   BUN 12.9 06/09/2015 1222   BUN 9 07/03/2014 2017   CREATININE 0.8 06/09/2015 1222   CREATININE 0.70 07/03/2014 2017      Component Value Date/Time   CALCIUM 10.3 06/09/2015 1222   CALCIUM 10.2 07/03/2014 2017   ALKPHOS 127 06/09/2015 1222   ALKPHOS 119* 07/03/2014 2017   AST 17 06/09/2015 1222   AST 16 07/03/2014 2017   ALT 23 06/09/2015 1222   ALT 16 07/03/2014 2017   BILITOT 0.28 06/09/2015 1222   BILITOT 0.3 07/03/2014 2017       Lab Results  Component Value Date   WBC 8.0 06/09/2015   HGB 11.2* 06/09/2015   HCT 35.1 06/09/2015   MCV 74.5* 06/09/2015   PLT 334 06/09/2015   NEUTROABS 5.1 06/09/2015      ASSESSMENT & PLAN:  Breast cancer of upper-outer quadrant of left female breast Left lumpectomy 06/17/2015: Invasive ductal carcinoma, grade 3, tumor size 1.6 cm, margins negative posterior 1 mm, 0/2 sentinel nodes, ER 100%, PR 60%, HER-2 negative ratio 0.67, Ki-67 30%, T1 cN0 stage IA, Oncotype DX score 25, 16% risk of recurrence with tamoxifen alone, completed adjuvant radiation 08/27/2015  started anastrozole 09/27/2015.  Recommendations: Adjuvant anastrozole 1 mg daily 5 years started 09/27/15  Anastrozole Toxicities: 1. Hot flashes twice a day with sweating 2. Fatigue and decrease in interest in sex I encouraged her to take half a tablet daily for the next few months and then increase it to full tablet when she is able to tolerate it better. He also instructed her to take it in the morning with breakfast to see if it would make any difference.  Patient recently got engaged and she feels guilty about her lack of energy. Patient is extremely anxious about antiestrogen therapy because she does not want to feel "old" as a result of antiestrogen treatment.   I provided her information regarding weight loss program  F/U in 6 months to assess toxicities to anastrozole  No orders of the defined types were  placed in this encounter.   The patient has a good understanding of the overall plan. she agrees with it. she will call with any problems that may develop before the next visit here.   Rulon Eisenmenger, MD 11/30/2015

## 2015-11-30 NOTE — Telephone Encounter (Signed)
Called patient and left a message with her 65mo follow up

## 2016-03-12 ENCOUNTER — Encounter (HOSPITAL_BASED_OUTPATIENT_CLINIC_OR_DEPARTMENT_OTHER): Payer: Self-pay | Admitting: Emergency Medicine

## 2016-03-12 ENCOUNTER — Emergency Department (HOSPITAL_BASED_OUTPATIENT_CLINIC_OR_DEPARTMENT_OTHER)
Admission: EM | Admit: 2016-03-12 | Discharge: 2016-03-12 | Disposition: A | Discharge status: Home / Self Care | Payer: BLUE CROSS/BLUE SHIELD | Attending: Emergency Medicine | Admitting: Emergency Medicine

## 2016-03-12 ENCOUNTER — Emergency Department (HOSPITAL_BASED_OUTPATIENT_CLINIC_OR_DEPARTMENT_OTHER): Payer: BLUE CROSS/BLUE SHIELD

## 2016-03-12 DIAGNOSIS — R0789 Other chest pain: Secondary | ICD-10-CM | POA: Diagnosis present

## 2016-03-12 DIAGNOSIS — R079 Chest pain, unspecified: Secondary | ICD-10-CM

## 2016-03-12 DIAGNOSIS — Z853 Personal history of malignant neoplasm of breast: Secondary | ICD-10-CM | POA: Insufficient documentation

## 2016-03-12 DIAGNOSIS — Z87891 Personal history of nicotine dependence: Secondary | ICD-10-CM | POA: Diagnosis not present

## 2016-03-12 LAB — BASIC METABOLIC PANEL
Anion gap: 8 (ref 5–15)
BUN: 12 mg/dL (ref 6–20)
CHLORIDE: 106 mmol/L (ref 101–111)
CO2: 26 mmol/L (ref 22–32)
Calcium: 9.8 mg/dL (ref 8.9–10.3)
Creatinine, Ser: 0.72 mg/dL (ref 0.44–1.00)
GFR calc Af Amer: >60 mL/min (ref >60)
GFR calc non Af Amer: >60 mL/min (ref >60)
GLUCOSE: 108 mg/dL — AB (ref 65–99)
POTASSIUM: 3.9 mmol/L (ref 3.5–5.1)
SODIUM: 140 mmol/L (ref 135–145)

## 2016-03-12 LAB — CBC
HEMATOCRIT: 40.8 % (ref 36.0–46.0)
Hemoglobin: 13.9 g/dL (ref 12.0–15.0)
MCH: 29.5 pg (ref 26.0–34.0)
MCHC: 34.1 g/dL (ref 30.0–36.0)
MCV: 86.6 fL (ref 78.0–100.0)
Platelets: 283 10*3/uL (ref 150–400)
RBC: 4.71 MIL/uL (ref 3.87–5.11)
RDW: 14.3 % (ref 11.5–15.5)
WBC: 9.5 10*3/uL (ref 4.0–10.5)

## 2016-03-12 LAB — TROPONIN I: Troponin I: <0.03 ng/mL (ref <0.031)

## 2016-03-12 NOTE — ED Provider Notes (Signed)
CSN: [id number]     Arrival date & time 03/12/16  2017 History  By signing my name below, I, Altamease Oiler, attest that this documentation has been prepared under the direction and in the presence of Deno Etienne, DO. Electronically Signed: Altamease Oiler, ED Scribe. 03/12/2016. 9:54 PM     Chief Complaint  Patient presents with  . Chest Pain    The history is provided by the patient. No language interpreter was used.   Norma Robles is a 55 y.o. female with PMHx of left breast cancer s/p radiation who presents to the Emergency Department complaining of new, 8/10 in severity, cramping, left breast pain with onset this afternoon while getting groceries out of the car. Pt states that she lifted a case of water before the onset of pain. Removing an under-wired bra and taking Advil initially provided some relief but the pain returned. Coughing and movement exacerbate the pain. Pt denies LE swelling. She finished radiation in October of last year and is waiting to have another mammogram.   Past Medical History  Diagnosis Date  . Breast cancer of upper-outer quadrant of left female breast ([place]) 06/04/2015   Past Surgical History  Procedure Laterality Date  . Tonsillectomy    . Cholecystectomy    . Uterine fibroid embolization    . Breast lumpectomy with radioactive seed and sentinel lymph node biopsy Left 06/17/2015    Procedure: RADIOACTIVE SEED LOCALIZATION LEFT BREAST LUMPECTOMY AND LEFT AXILLARY SENTINEL LYMPH NODE BIOPSY;  Surgeon: Excell Seltzer, MD;  Location: [facility];  Service: General;  Laterality: Left;   Family History  Problem Relation Age of Onset  . Uterine cancer Mother   . Breast cancer Maternal Aunt    Social History  Substance Use Topics  . Smoking status: Former Smoker -- 0.25 packs/day for 10 years    Types: Cigarettes    Quit date: 11/13/1994  . Smokeless tobacco: Never Used  . Alcohol Use: 0.0 oz/week    0 Standard drinks or equivalent per week     Comment: occas   OB History    No data available     Review of Systems  Constitutional: Negative for fever and chills.  HENT: Negative for congestion and rhinorrhea.   Eyes: Negative for redness and visual disturbance.  Respiratory: Negative for shortness of breath and wheezing.   Cardiovascular: Positive for chest pain. Negative for palpitations and leg swelling.  Gastrointestinal: Negative for nausea and vomiting.  Genitourinary: Negative for dysuria and urgency.  Musculoskeletal: Negative for myalgias and arthralgias.  Skin: Negative for pallor and wound.  Neurological: Negative for dizziness and headaches.  All other systems reviewed and are negative.   Allergies  Codeine  Home Medications   Prior to Admission medications   Medication Sig Start Date End Date Taking? Authorizing Provider  ALPRAZolam (XANAX) 0.25 MG tablet Take 0.25 mg by mouth. 08/02/15   Historical Provider, MD  anastrozole (ARIMIDEX) 1 MG tablet Take 1 tablet (1 mg total) by mouth daily. 08/30/15   Nicholas Lose, MD  hydrochlorothiazide (MICROZIDE) 12.5 MG capsule as needed.  12/15/14   Historical Provider, MD  Sennosides (EQ LAXATIVE PO) Take by mouth as needed.    Historical Provider, MD   BP 145/99 mmHg  Pulse 85  Temp(Src) 98 F (36.7 C) (Oral)  Resp 16  Ht 5' (1.524 m)  Wt 155 lb (70.308 kg)  BMI 30.27 kg/m2  SpO2 100%  LMP 04/14/2015 Physical Exam  Constitutional: She is oriented to  person, place, and time. She appears well-developed and well-nourished. No distress.  HENT:  Head: Normocephalic and atraumatic.  Eyes: EOM are normal. Pupils are equal, round, and reactive to light.  Neck: Normal range of motion. Neck supple.  Cardiovascular: Normal rate and regular rhythm.  Exam reveals no gallop and no friction rub.   No murmur heard. Upper extremity pulses 2+ and equal   Pulmonary/Chest: Effort normal. She has no wheezes. She has no rales. She exhibits tenderness.  TTP about left lateral  chest wall and mid axillary sign about the fifth rib Reproducible with movement of the left arm\CTAB   Abdominal: Soft. She exhibits no distension. There is no tenderness.  Musculoskeletal: She exhibits no edema or tenderness.  No LE edema   Neurological: She is alert and oriented to person, place, and time.  Skin: Skin is warm and dry. She is not diaphoretic.  Psychiatric: She has a normal mood and affect. Her behavior is normal.  Nursing note and vitals reviewed.   ED Course  Procedures (including critical care time) DIAGNOSTIC STUDIES: Oxygen Saturation is 100% on RA,  normal by my interpretation.    COORDINATION OF CARE: 9:41 PM Discussed treatment plan which includes lab work, CXR, EKG with pt at bedside and pt agreed to plan.  Labs Review Labs Reviewed  BASIC METABOLIC PANEL - Abnormal; Notable for the following:    Glucose, Bld 108 (*)    All other components within normal limits  CBC  TROPONIN I    Imaging Review No results found. I have personally reviewed and evaluated these images and lab results as part of my medical decision-making.   EKG Interpretation   Date/Time:  Sunday Mar 12 2016 20:23:35 EDT Ventricular Rate:  90 PR Interval:  156 QRS Duration: 74 QT Interval:  350 QTC Calculation: 428 R Axis:   52 Text Interpretation:  Normal sinus rhythm Low voltage QRS No significant  change since last tracing Confirmed by Oliva Montecalvo MD, Quillian Quince [id number]) on  03/12/2016 9:06:05 PM      MDM   Final diagnoses:  Chest pain, unspecified chest pain type    55 yo F With a chief complaint of left-sided chest wall pain. Worse with movement palpation deep breaths. Started after she had lifted a heavy object from her car. Symptoms are completely reproducible with palpation. Triage obtained the EKG chest x-ray troponin which were all unremarkable. Discussed with the patient risks and benefits of staying for delta troponin as well as the possibility of obtaining a d-dimer or CT  angiogram chest. Patient is comfortable with the 2% risk for MI. She also feels that it's unlikely she has a blood clot. She will follow with her family doctor.  I personally performed the services described in this documentation, which was scribed in my presence. The recorded information has been reviewed and is accurate.   9:59 PM:  I have discussed the diagnosis/risks/treatment options with the patient and family and believe the pt to be eligible for discharge home to follow-up with PCP. We also discussed returning to the ED immediately if new or worsening sx occur. We discussed the sx which are most concerning (e.g., sudden worsening pain, fever, inability to tolerate by mouth, sob, syncope, hemoptysis) that necessitate immediate return. Medications administered to the patient during their visit and any new prescriptions provided to the patient are listed below.  Medications given during this visit Medications - No data to display  New Prescriptions   No medications on file  The patient appears reasonably screen and/or stabilized for discharge and I doubt any other medical condition or other [facility] requiring further screening, evaluation, or treatment in the ED at this time prior to discharge.     Deno Etienne, DO 03/12/16 2159

## 2016-03-12 NOTE — ED Notes (Signed)
Patient states that she went to the store and picked up a case a water, soon after she had some pain to her left breast and into her arm. History of Breast cancer in that breast. Reports that she has increased pain with movement

## 2016-03-12 NOTE — ED Notes (Signed)
MD at bedside. 

## 2016-03-12 NOTE — Discharge Instructions (Signed)
Take 4 over the counter ibuprofen tablets 3 times a day or 2 over-the-counter naproxen tablets twice a day for pain.  Chest Wall Pain Chest wall pain is pain in or around the bones and muscles of your chest. Sometimes, an injury causes this pain. Sometimes, the cause may not be known. This pain may take several weeks or longer to get better. HOME CARE Pay attention to any changes in your symptoms. Take these actions to help with your pain:  Rest as told by your doctor.  Avoid activities that cause pain. Try not to use your chest, belly (abdominal), or side muscles to lift heavy things.  If directed, apply ice to the painful area:  Put ice in a plastic bag.  Place a towel between your skin and the bag.  Leave the ice on for 20 minutes, 2-3 times per day.  Take over-the-counter and prescription medicines only as told by your doctor.  Do not use tobacco products, including cigarettes, chewing tobacco, and e-cigarettes. If you need help quitting, ask your doctor.  Keep all follow-up visits as told by your doctor. This is important. GET HELP IF:  You have a fever.  Your chest pain gets worse.  You have new symptoms. GET HELP RIGHT AWAY IF:  You feel sick to your stomach (nauseous) or you throw up (vomit).  You feel sweaty or light-headed.  You have a cough with phlegm (sputum) or you cough up blood.  You are short of breath.   This information is not intended to replace advice given to you by your health care provider. Make sure you discuss any questions you have with your health care provider.   Document Released: 04/03/2008 Document Revised: 07/07/2015 Document Reviewed: 01/11/2015 Elsevier Interactive Patient Education [employer].

## 2016-03-15 ENCOUNTER — Other Ambulatory Visit: Payer: Self-pay | Admitting: Obstetrics and Gynecology

## 2016-03-15 DIAGNOSIS — Z853 Personal history of malignant neoplasm of breast: Secondary | ICD-10-CM

## 2016-03-15 DIAGNOSIS — Z9889 Other specified postprocedural states: Secondary | ICD-10-CM

## 2016-05-16 ENCOUNTER — Ambulatory Visit (HOSPITAL_BASED_OUTPATIENT_CLINIC_OR_DEPARTMENT_OTHER): Payer: BLUE CROSS/BLUE SHIELD | Admitting: Hematology and Oncology

## 2016-05-16 ENCOUNTER — Telehealth: Payer: Self-pay | Admitting: Hematology and Oncology

## 2016-05-16 ENCOUNTER — Encounter: Payer: Self-pay | Admitting: Hematology and Oncology

## 2016-05-16 VITALS — BP 123/57 | HR 79 | Temp 97.8°F | Resp 18 | Wt 156.9 lb

## 2016-05-16 DIAGNOSIS — N951 Menopausal and female climacteric states: Secondary | ICD-10-CM

## 2016-05-16 DIAGNOSIS — Z79811 Long term (current) use of aromatase inhibitors: Secondary | ICD-10-CM

## 2016-05-16 DIAGNOSIS — C50412 Malignant neoplasm of upper-outer quadrant of left female breast: Secondary | ICD-10-CM | POA: Diagnosis not present
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
## 2016-05-16 NOTE — Assessment & Plan Note (Signed)
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  Current treatment: Adjuvant anastrozole 1 mg daily 5 years started 09/27/15  Anastrozole Toxicities: 1. Hot flashes twice a day with sweating 2. Fatigue and decrease in interest in sex I encouraged her to take half a tablet daily for the next few months and then increase it to full tablet when she is able to tolerate it better. We also instructed her to take it in the morning with breakfast to see if it would make any difference.  Patient recently got engaged and she feels guilty about her lack of energy. Patient is extremely anxious about antiestrogen therapy because she does not want to feel "old" as a result of antiestrogen treatment.   I provided her information regarding weight loss program  F/U in 6 months to assess toxicities to anastrozole

## 2016-05-16 NOTE — Telephone Encounter (Signed)
appt made and avs printed °

## 2016-05-24 ENCOUNTER — Ambulatory Visit
Admission: RE | Admit: 2016-05-24 | Discharge: 2016-05-24 | Disposition: A | Discharge status: Home / Self Care | Payer: BLUE CROSS/BLUE SHIELD | Source: Ambulatory Visit | Attending: Hematology and Oncology | Admitting: Hematology and Oncology

## 2016-05-24 ENCOUNTER — Ambulatory Visit
Admission: RE | Admit: 2016-05-24 | Discharge: 2016-05-24 | Disposition: A | Discharge status: Home / Self Care | Payer: BLUE CROSS/BLUE SHIELD | Source: Ambulatory Visit | Attending: Obstetrics and Gynecology | Admitting: Obstetrics and Gynecology

## 2016-05-24 DIAGNOSIS — Z9889 Other specified postprocedural states: Secondary | ICD-10-CM

## 2016-05-24 DIAGNOSIS — Z853 Personal history of malignant neoplasm of breast: Secondary | ICD-10-CM

## 2016-05-24 DIAGNOSIS — C50412 Malignant neoplasm of upper-outer quadrant of left female breast: Secondary | ICD-10-CM

## 2016-05-31 ENCOUNTER — Telehealth: Payer: Self-pay | Admitting: Hematology and Oncology

## 2016-05-31 ENCOUNTER — Other Ambulatory Visit: Payer: Self-pay | Admitting: Hematology and Oncology

## 2016-05-31 MED ORDER — TAMOXIFEN CITRATE 20 MG PO TABS: 20.0000 mg | ORAL_TABLET | Freq: Every day | ORAL | 3 refills | Status: DC

## 2016-05-31 NOTE — Telephone Encounter (Signed)
I called the patient to discuss the result of bone density which showed a T score of -3 which is osteoporosis. We had lengthy discussion regarding different options for treatment. Patient absolutely does not want to go on a bisphosphonate therapy. I recommended then to switch her from anastrozole to tamoxifen. This is because tamoxifen could help with improvement in bone density. She will take 10 mg of tamoxifen for a month and see tolerates it better she will increase it to the full tablet dose of 20 mg daily. She will also get calcium and take it along with vitamin D. We may have to obtain a bone density in a year to recheck on her bone density status to see if the current treatment plan is working.

## 2016-07-03 IMAGING — MG MM DIAG BREAST TOMO UNI LEFT
9 of 12 series · 9 of 28 positions shown · non-contrast
Comparison: Previous exam(s).

CLINICAL DATA: 53-year-old female presenting for diagnostic imaging
possible left breast asymmetries.

EXAM:
DIGITAL DIAGNOSTIC LEFT MAMMOGRAM WITH 3D TOMOSYNTHESIS WITH CAD
ULTRASOUND LEFT BREAST

[L CC (1 of 2)]
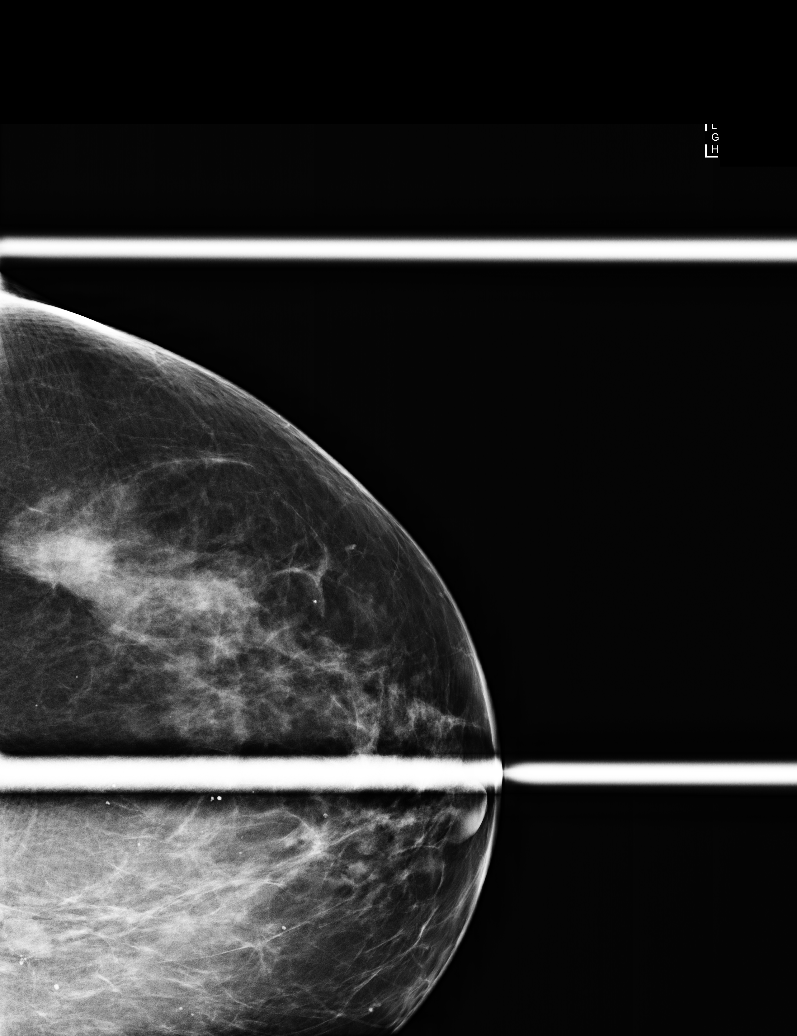

[L MLO (1 of 2)]
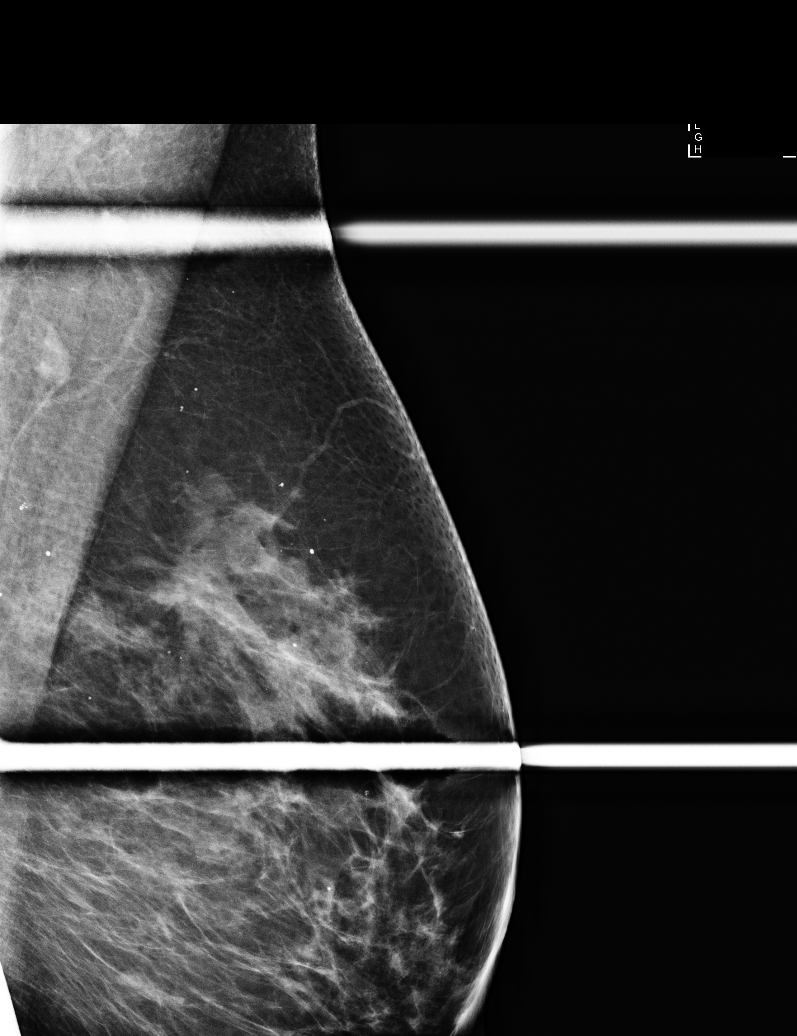

[L CC (2 of 2)]
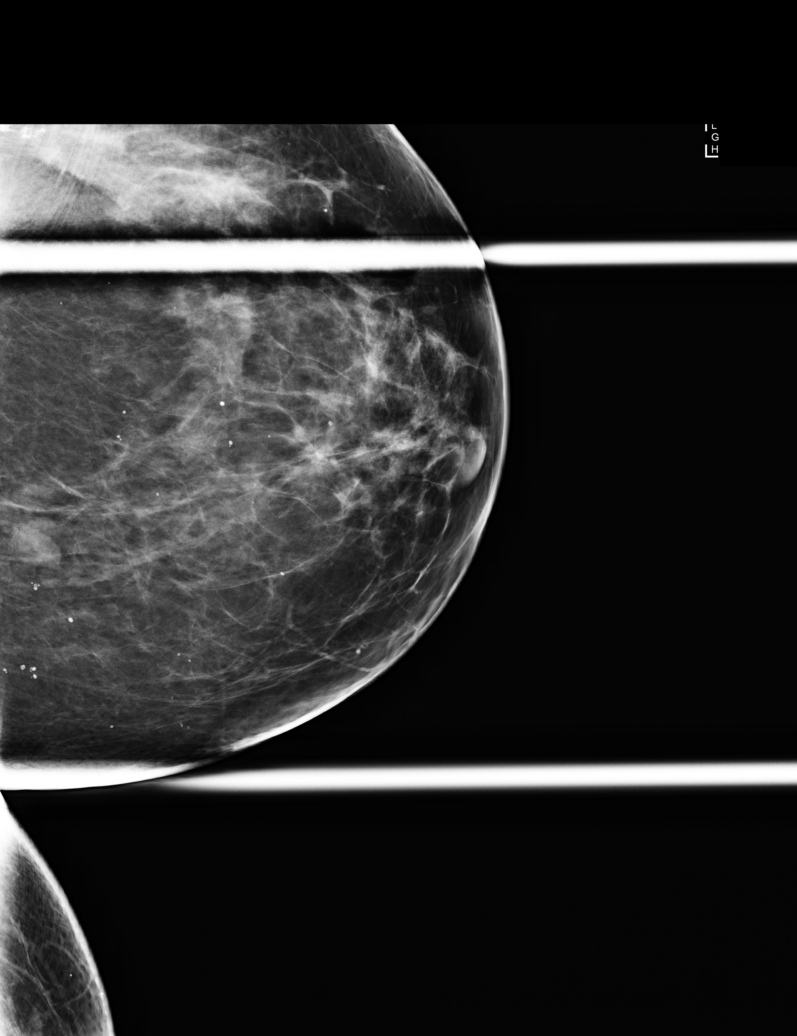

[L MLO (2 of 2)]
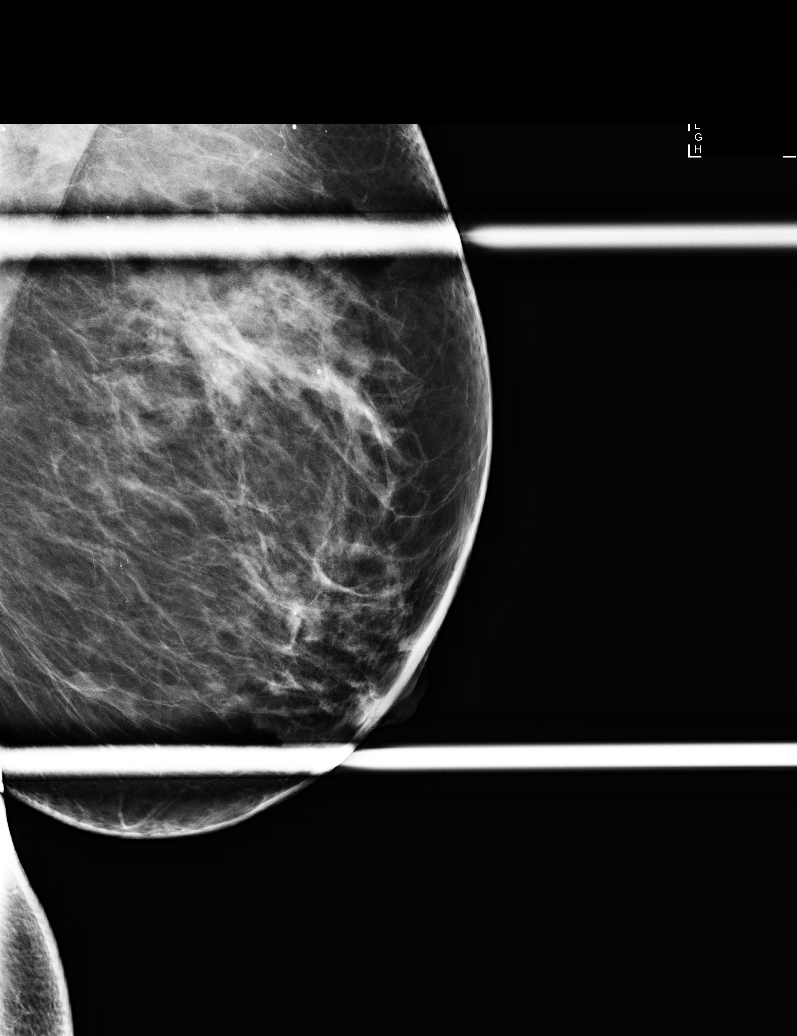

[L CC tomo (1 of 3)]
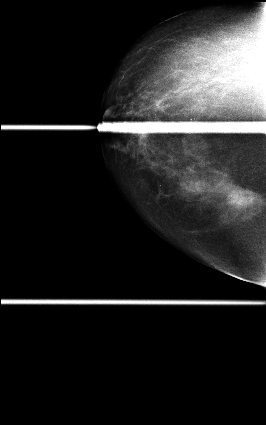

[L MLO tomo (1 of 2)]
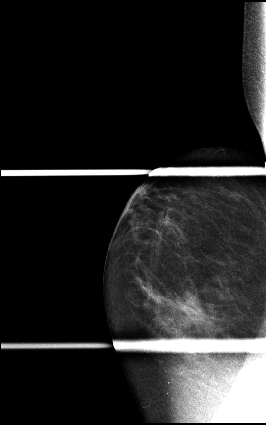

[L MLO tomo (2 of 2)]
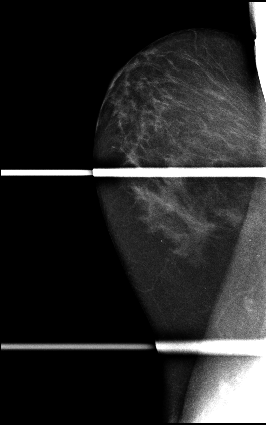

[L CC tomo (2 of 3)]
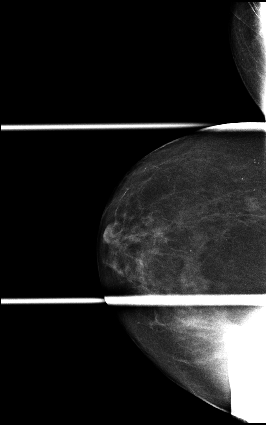

[L CC tomo (3 of 3) · tomo slice 45/88.0]
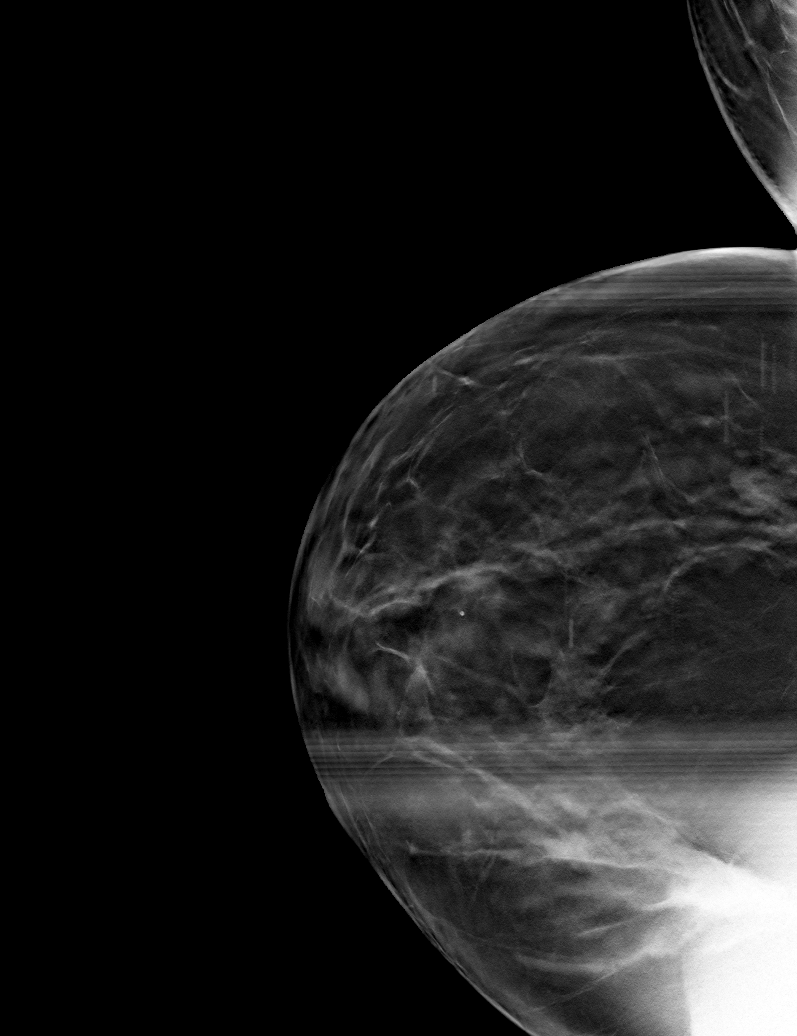

[9 of 28 positions shown; findings below may reference images not displayed]

ACR Breast Density Category c: The breast tissue is heterogeneously
dense, which may obscure small masses.
FINDINGS: On spot tomosynthesis views of the upper outer quadrant of the left
breast, there is persistent asymmetry with possible distortion at
the approximate 130 position, posterior depth. Spot tomosynthesis
views of the upper inner quadrant demonstrate a persistent asymmetry
at the approximate 11 o'clock position, posterior depth. A third
questioned asymmetry in the 6 o'clock position of the left breast
appears stable from prior exams, and on tomosynthesis images appears
consistent with normal fibroglandular tissue.

Mammographic images were processed with CAD.

On physical exam, there is a possible 1-2 cm mass at the 130
position. No definite masses palpated in the upper inner quadrant of
the left breast.

Targeted ultrasound is performed, showing a hypoechoic mass at 130
and left breast, 7 cm from the nipple measuring approximately 1.6 x
0.9 x 1.4 cm. There is a border along the superior margin which
appears indistinct. There may be a small cystic area within the
mass.

The left axilla was scanned demonstrating normal, benign-appearing
lymph nodes.

Ultrasound targeted to the upper inner quadrant of left breast at 11
o'clock, 8 cm from the nipple demonstrates a primarily anechoic mass
with circumscribed margins measuring 1.0 x 0.4 x 0.7 cm. There is a
hypoechoic area within the cyst, possibly debris.
IMPRESSION: 1. A 1.6 cm mass with a possible internal cystic area, and with an
indistinct superior border, corresponds with the mammographic area
of concern in the upper outer left breast.

2. There is a 1.0 cm probable complicated cyst in the upper inner
quadrant of the left breast, corresponding to the mammographic
asymmetry.

RECOMMENDATION:
1. Ultrasound guided biopsy is recommended for the mass in the left
breast at 130.

2. Ultrasound-guided cyst aspiration/possible biopsy is recommended
for the probable complicated cyst in the upper inner quadrant of the
left breast.

3. The recommended procedures have been added on same-day, and the
procedure report will be dictated separately.

I have discussed the findings and recommendations with the patient.
Results were also provided in writing at the conclusion of the
visit. If applicable, a reminder letter will be sent to the patient
regarding the next appointment.

BI-RADS CATEGORY  4: Suspicious.

## 2016-09-08 ENCOUNTER — Ambulatory Visit (HOSPITAL_BASED_OUTPATIENT_CLINIC_OR_DEPARTMENT_OTHER): Payer: BLUE CROSS/BLUE SHIELD | Admitting: Nurse Practitioner

## 2016-09-08 ENCOUNTER — Telehealth: Payer: Self-pay

## 2016-09-08 VITALS — BP 139/92 | HR 53 | Temp 98.0°F | Resp 18 | Ht 60.0 in | Wt 156.4 lb

## 2016-09-08 DIAGNOSIS — N61 Mastitis without abscess: Secondary | ICD-10-CM

## 2016-09-08 DIAGNOSIS — N951 Menopausal and female climacteric states: Secondary | ICD-10-CM

## 2016-09-08 DIAGNOSIS — C50412 Malignant neoplasm of upper-outer quadrant of left female breast: Secondary | ICD-10-CM

## 2016-09-08 DIAGNOSIS — Z79811 Long term (current) use of aromatase inhibitors: Secondary | ICD-10-CM

## 2016-09-08 MED ORDER — CEPHALEXIN 500 MG PO CAPS: 500.0000 mg | ORAL_CAPSULE | Freq: Four times a day (QID) | ORAL | 0 refills | Status: DC

## 2016-09-08 NOTE — Telephone Encounter (Signed)
Pt left vm regarding new onset of redness, swelling, and heat on L breast this morning. Called pt back to ask if there was any fevers or chills. Pt states " I woke up this morning and noticed the L breast where they did lumpectomy and radiation a year ago is hot, swollen, tender and bigger than the right. I went to pcp last week for hot flashes from my tamoxifen, and he prescribed me neurontin. That is all that changed since the last week." Pt denies any open area and swelling on the other breast. Told pt that she may need to come in to see symptom management to evaluate pt for possible cellulitis. Pt agrees and would like to get evaluated as soon as she can. Requested for pt to come in at 11am today. Pt verbalized understanding and will come in today.

## 2016-09-11 ENCOUNTER — Telehealth: Payer: Self-pay

## 2016-09-11 ENCOUNTER — Encounter: Payer: Self-pay | Admitting: Nurse Practitioner

## 2016-09-11 DIAGNOSIS — N61 Mastitis without abscess: Secondary | ICD-10-CM | POA: Insufficient documentation

## 2016-09-11 MED ORDER — FLUCONAZOLE 150 MG PO TABS: ORAL_TABLET | ORAL | 1 refills | Status: DC

## 2016-09-11 NOTE — Assessment & Plan Note (Signed)
Patient states that she awoke this morning with erythema, warmth to her left breast region.  She denies any pain to the site.  She also denies any recent fevers or chills.  Exam today reveals erythema to the entire left breast; and the left breast is warm as well.  There is no tenderness, edema, or red streaks to the site.  Most likely, this is left breast cellulitis; and patient will be treated with Keflex antibiotics.  Also, patient mentioned that she typically has a yeast infection when she takes antibiotics; is requesting a prescription for Diflucan to take on an as-needed basis.  Patient was advised to call/return or go directly to the emergency department for any worsening symptoms whatsoever.

## 2016-09-11 NOTE — Progress Notes (Signed)
SYMPTOM MANAGEMENT CLINIC    Chief Complaint: Cellulitis  HPI:  Norma Robles 55 y.o. female diagnosed with breast cancer.  Currently undergoing tamoxifen oral therapy.     Breast cancer of upper-outer quadrant of left female breast ([place])   06/02/2015 Breast US    Left breast: hypoechoic mass at 130, 7 cm from the nipple measuring approximately 1.6 x 0.9 x 1.4 cm. There is a border along the superior margin which appears indistinct.       06/02/2015 Initial Biopsy    Left breast biopsy 1:30 position: Invasive ductal carcinoma with associated microcalcification, grade 1-2, ER+ (100%), PR+ (60%), HER-2 negative, Ki-67 30%, 1.6 cm tumor       06/03/2015 Clinical Stage    Stage IA: T1c N0      06/17/2015 Definitive Surgery    Left lumpectomy/SLNB ([facility]): Invasive ductal carcinoma, grade 3, tumor size 1.6 cm, HER2/neu repeated and remains negative (ratio 0.67),  margins negative posterior 1 mm, 0/2 sentinel nodes      06/17/2015 Pathologic Stage    Stage IA: T1c N0      06/17/2015 Oncotype testing    Score 25 (16% ROR)      08/02/2015 - 08/27/2015 Radiation Therapy    Adjuvant XRT Isidore Moos): Left Breast. 40.05 Gy in 15 fractions. Left Breast Boost. 10 Gy in 5 fractions.  Total dose: 50 Gy      08/30/2015 -  Anti-estrogen oral therapy    Anastrozole 1 mg daily. Planned duration of therapy 5 years.      09/27/2015 Survivorship    Survivorship care plan completed and mailed to patient in lieu of in person visit       Review of Systems  Skin:       Left breast cellulitis  All other systems reviewed and are negative.   Past Medical History:  Diagnosis Date  . Breast cancer of upper-outer quadrant of left female breast ([place]) 06/04/2015    Past Surgical History:  Procedure Laterality Date  . BREAST LUMPECTOMY WITH RADIOACTIVE SEED AND SENTINEL LYMPH NODE BIOPSY Left 06/17/2015   Procedure: RADIOACTIVE SEED LOCALIZATION LEFT BREAST LUMPECTOMY AND LEFT AXILLARY SENTINEL LYMPH  NODE BIOPSY;  Surgeon: Excell Seltzer, MD;  Location: [employer];  Service: General;  Laterality: Left;  . CHOLECYSTECTOMY    . TONSILLECTOMY    . UTERINE FIBROID EMBOLIZATION      has Achilles tendonitis, bilateral; Equinus deformity of foot, acquired; Breast cancer of upper-outer quadrant of left female breast ([place]); and Cellulitis of breast on her problem list.    is allergic to codeine.    Medication List       Accurate as of 09/08/16 11:59 PM. Always use your most recent med list.          cephALEXin 500 MG capsule Commonly known as:  KEFLEX Take 1 capsule (500 mg total) by mouth 4 (four) times daily.   EQ LAXATIVE PO Take by mouth as needed.   fluconazole 150 MG tablet Commonly known as:  DIFLUCAN Take 1 tablet PO once PRN yeast infection.   gabapentin 100 MG capsule Commonly known as:  NEURONTIN Take 300 mg by mouth daily.   hydrochlorothiazide 12.5 MG capsule Commonly known as:  MICROZIDE as needed.   tamoxifen 20 MG tablet Commonly known as:  NOLVADEX Take 1 tablet (20 mg total) by mouth daily.        PHYSICAL EXAMINATION  Oncology Vitals 09/08/2016 05/16/2016  Height 152 cm -  Weight  70.943 kg 71.169 kg  Weight (lbs) 156 lbs 6 oz 156 lbs 14 oz  BMI (kg/m2) 30.54 kg/m2 30.64 kg/m2  Temp 98 97.8  Pulse 53 79  Resp 18 18  SpO2 98 99  BSA (m2) 1.73 m2 1.74 m2   BP Readings from Last 2 Encounters:  09/08/16 (!) 139/92  05/16/16 (!) 123/57    Physical Exam  Constitutional: She is oriented to person, place, and time and well-developed, well-nourished, and in no distress.  HENT:  Head: Normocephalic and atraumatic.  Eyes: Conjunctivae and EOM are normal. Pupils are equal, round, and reactive to light.  Neck: Normal range of motion.  Pulmonary/Chest: Effort normal. No respiratory distress.  Musculoskeletal: Normal range of motion.  Neurological: She is alert and oriented to person, place, and time. Gait normal.  Skin: Skin is  warm and dry. There is erythema.  Entire left breast region with erythema and warmth; but no tenderness, or red streaks.  Psychiatric: Affect normal.  Nursing note and vitals reviewed.   LABORATORY DATA:. No visits with results within 3 Day(s) from this visit.  Latest known visit with results is:  Admission on 03/12/2016, Discharged on 03/12/2016  Component Date Value Ref Range Status  . Sodium 03/12/2016 140  135 - 145 mmol/L Final  . Potassium 03/12/2016 3.9  3.5 - 5.1 mmol/L Final  . Chloride 03/12/2016 106  101 - 111 mmol/L Final  . CO2 03/12/2016 26  22 - 32 mmol/L Final  . Glucose, Bld 03/12/2016 108* 65 - 99 mg/dL Final  . BUN 03/12/2016 12  6 - 20 mg/dL Final  . Creatinine, Ser 03/12/2016 0.72  0.44 - 1.00 mg/dL Final  . Calcium 03/12/2016 9.8  8.9 - 10.3 mg/dL Final  . GFR calc non Af Amer 03/12/2016 >60  >60 mL/min Final  . GFR calc Af Amer 03/12/2016 >60  >60 mL/min Final   Comment: (NOTE) The eGFR has been calculated using the CKD EPI equation. This calculation has not been validated in all clinical situations. eGFR's persistently <60 mL/min signify possible Chronic Kidney Disease.   . Anion gap 03/12/2016 8  5 - 15 Final  . WBC 03/12/2016 9.5  4.0 - 10.5 K/uL Final  . RBC 03/12/2016 4.71  3.87 - 5.11 MIL/uL Final  . Hemoglobin 03/12/2016 13.9  12.0 - 15.0 g/dL Final  . HCT 03/12/2016 40.8  36.0 - 46.0 % Final  . MCV 03/12/2016 86.6  78.0 - 100.0 fL Final  . MCH 03/12/2016 29.5  26.0 - 34.0 pg Final  . MCHC 03/12/2016 34.1  30.0 - 36.0 g/dL Final  . RDW 03/12/2016 14.3  11.5 - 15.5 % Final  . Platelets 03/12/2016 283  150 - 400 K/uL Final  . Troponin I 03/12/2016 <0.03  <0.031 ng/mL Final   Comment:        NO INDICATION OF MYOCARDIAL INJURY.        RADIOGRAPHIC STUDIES: No results found.  ASSESSMENT/PLAN:    Cellulitis of breast Patient states that she awoke this morning with erythema, warmth to her left breast region.  She denies any pain to the site.   She also denies any recent fevers or chills.  Exam today reveals erythema to the entire left breast; and the left breast is warm as well.  There is no tenderness, edema, or red streaks to the site.  Most likely, this is left breast cellulitis; and patient will be treated with Keflex antibiotics.  Also, patient mentioned that she typically has a  yeast infection when she takes antibiotics; is requesting a prescription for Diflucan to take on an as-needed basis.  Patient was advised to call/return or go directly to the emergency department for any worsening symptoms whatsoever.  Breast cancer of upper-outer quadrant of left female breast [facility]) Patient takes tamoxifen oral therapy as directed.  She states that she has been experiencing chronic hot flashes; has recently been prescribed Neurontin to see if this would help with the hot flashes.  Patient is scheduled to return on 11/16/2016 for follow-up visit.   Patient stated understanding of all instructions; and was in agreement with this plan of care. The patient knows to call the clinic with any problems, questions or concerns.   Total time spent with patient was 25 minutes;  with greater than 75 percent of that time spent in face to face counseling regarding patient's symptoms,  and coordination of care and follow up.  Disclaimer:This dictation was prepared with Dragon/digital dictation along with [employer]. Any transcriptional errors that result from this process are unintentional.  Drue Second, NP 09/11/2016

## 2016-09-11 NOTE — Telephone Encounter (Signed)
Breast is looking much better, the heat is gone and color is better. Pt will continue antibiotic. Informed Diflucan was e-scribed today if she needs it.

## 2016-09-11 NOTE — Assessment & Plan Note (Signed)
Patient takes tamoxifen oral therapy as directed.  She states that she has been experiencing chronic hot flashes; has recently been prescribed Neurontin to see if this would help with the hot flashes.  Patient is scheduled to return on 11/16/2016 for follow-up visit.

## 2016-11-15 ENCOUNTER — Telehealth: Payer: Self-pay | Admitting: Hematology and Oncology

## 2016-11-15 NOTE — Telephone Encounter (Signed)
Returned call to pt to inform her of r/s appt date 1/29 at 1145 am due to weather conditions

## 2016-11-15 NOTE — Telephone Encounter (Signed)
Patient called to cancel appointment due to the bad weather and needs to reschedule call back number is [phone]

## 2016-11-16 ENCOUNTER — Ambulatory Visit: Payer: BLUE CROSS/BLUE SHIELD | Admitting: Hematology and Oncology

## 2016-11-26 NOTE — Assessment & Plan Note (Signed)
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  Current treatment: Adjuvant anastrozole 1 mg daily 5 years started 09/27/15  Anastrozole Toxicities: 1. Hot flashes twice a day with sweating 2. Fatigue and decrease in interest in sex I encouraged her to take half a tablet daily for the next few months and then increase it to full tablet when she is able to tolerate it better. We also instructed her to take it in the morning with breakfast to see if it would make any difference.  Patient recently got engaged and she feels guilty about her lack of energy. Patient is extremely anxious about antiestrogen therapy because she does not want to feel "old" as a result of antiestrogen treatment.   I ordered a bone density test to be done on 05/24/2016 at the breast center.  F/U in 6 months to assess toxicities to anastrozole

## 2016-11-27 ENCOUNTER — Ambulatory Visit (HOSPITAL_BASED_OUTPATIENT_CLINIC_OR_DEPARTMENT_OTHER): Payer: BLUE CROSS/BLUE SHIELD | Admitting: Hematology and Oncology

## 2016-11-27 ENCOUNTER — Encounter: Payer: Self-pay | Admitting: Hematology and Oncology

## 2016-11-27 DIAGNOSIS — R530 Neoplastic (malignant) related fatigue: Secondary | ICD-10-CM

## 2016-11-27 DIAGNOSIS — N951 Menopausal and female climacteric states: Secondary | ICD-10-CM

## 2016-11-27 DIAGNOSIS — Z170 Estrogen receptor positive status [ER+]: Secondary | ICD-10-CM

## 2016-11-27 DIAGNOSIS — C50412 Malignant neoplasm of upper-outer quadrant of left female breast: Secondary | ICD-10-CM

## 2016-11-27 DIAGNOSIS — Z79811 Long term (current) use of aromatase inhibitors: Secondary | ICD-10-CM | POA: Diagnosis not present

## 2016-11-27 DIAGNOSIS — M810 Age-related osteoporosis without current pathological fracture: Secondary | ICD-10-CM

## 2016-11-27 MED ORDER — TAMOXIFEN CITRATE 20 MG PO TABS: 10.0000 mg | ORAL_TABLET | Freq: Every day | ORAL | 3 refills | Status: DC

## 2016-11-27 MED ORDER — VENLAFAXINE HCL ER 37.5 MG PO CP24: 37.5000 mg | ORAL_CAPSULE | Freq: Every day | ORAL | 3 refills | Status: DC

## 2016-11-27 NOTE — Progress Notes (Signed)
Patient Care Team: [name], MD as PCP - General (Family Medicine) Molli Posey, MD as Consulting Physician (Obstetrics and Gynecology) Excell Seltzer, MD as Consulting Physician (General Surgery) Nicholas Lose, MD as Consulting Physician (Hematology and Oncology) Eppie Gibson, MD as Attending Physician (Radiation Oncology) Mauro Kaufmann, RN as Registered Nurse Rockwell Germany, RN as Registered Nurse Holley Bouche, NP as Nurse Practitioner (Nurse Practitioner) Sylvan Cheese, NP as Nurse Practitioner (Nurse Practitioner)  DIAGNOSIS:  Encounter Diagnosis  Name Primary?  . Malignant neoplasm of upper-outer quadrant of left breast in female, estrogen receptor positive ([place])     SUMMARY OF ONCOLOGIC HISTORY:   Breast cancer of upper-outer quadrant of left female breast ([place])   06/02/2015 Breast US    Left breast: hypoechoic mass at 130, 7 cm from the nipple measuring approximately 1.6 x 0.9 x 1.4 cm. There is a border along the superior margin which appears indistinct.       06/02/2015 Initial Biopsy    Left breast biopsy 1:30 position: Invasive ductal carcinoma with associated microcalcification, grade 1-2, ER+ (100%), PR+ (60%), HER-2 negative, Ki-67 30%, 1.6 cm tumor       06/03/2015 Clinical Stage    Stage IA: T1c N0      06/17/2015 Definitive Surgery    Left lumpectomy/SLNB ([facility]): Invasive ductal carcinoma, grade 3, tumor size 1.6 cm, HER2/neu repeated and remains negative (ratio 0.67),  margins negative posterior 1 mm, 0/2 sentinel nodes      06/17/2015 Pathologic Stage    Stage IA: T1c N0      06/17/2015 Oncotype testing    Score 25 (16% ROR)      08/02/2015 - 08/27/2015 Radiation Therapy    Adjuvant XRT Is[facility]ore [facility]os): Left Breast. 40.05 Gy in 15 fractions. Left Breast Boost. 10 Gy in 5 fractions.  Total dose: 50 Gy      08/30/2015 -  Anti-estrogen oral therapy    Anastrozole 1 mg daily. Switch to tamoxifen 10 mg daily July 2017 due to severe  osteoporosis T score -3      09/27/2015 Survivorship    Survivorship care plan completed and mailed to patient in lieu of in person visit       CHIEF COMPLIANT: Follow-up on tamoxifen  INTERVAL HISTORY: Sharnae Winfree is a 56 year old with above-mentioned history of breast cancer currently on antiestrogen therapy. She could not tolerate anastrozole because of a severe osteoporosis. We switched her to tamoxifen and she currently takes 10 mg daily. He does with that she has profound hot flashes that are unbearable.  REVIEW OF SYSTEMS:   Constitutional: Denies fevers, chills or abnormal weight loss Eyes: Denies blurriness of vision Ears, nose, mouth, throat, and face: Denies mucositis or sore throat Respiratory: Denies cough, dyspnea or wheezes Cardiovascular: Denies palpitation, chest discomfort Gastrointestinal:  Denies nausea, heartburn or change in bowel habits Skin: Denies abnormal skin rashes Lymphatics: Denies new lymphadenopathy or easy bruising Neurological:Denies numbness, tingling or new weaknesses Behavioral/Psych: Mood is stable, no new changes  Extremities: No lower extremity edema  All other systems were reviewed with the patient and are negative.  I have reviewed the past medical history, past surgical history, social history and family history with the patient and they are unchanged from previous note.  ALLERGIES:  is allergic to codeine.  MEDICATIONS:  Current Outpatient Prescriptions  Medication Sig Dispense Refill  . hydrochlorothiazide (MICROZIDE) 12.5 MG capsule as needed.   0  . Sennosides (EQ LAXATIVE PO) Take by mouth as  needed.    . tamoxifen (NOLVADEX) 20 MG tablet Take 0.5 tablets (10 mg total) by mouth daily. 90 tablet 3  . venlafaxine XR (EFFEXOR-XR) 37.5 MG 24 hr capsule Take 1 capsule (37.5 mg total) by mouth daily with breakfast. 30 capsule 3   No current facility-administered medications for this visit.     PHYSICAL EXAMINATION: ECOG PERFORMANCE  STATUS: 1 - Symptomatic but completely ambulatory  Vitals:   11/27/16 1151  BP: 134/75  Pulse: 73  Resp: 18  Temp: 97.8 F (36.6 C)   Filed Weights   11/27/16 1151  Weight: 154 lb 4.8 oz (70 kg)    GENERAL:alert, no distress and comfortable SKIN: skin color, texture, turgor are normal, no rashes or significant lesions EYES: normal, Conjunctiva are pink and non-injected, sclera clear OROPHARYNX:no exudate, no erythema and lips, buccal mucosa, and tongue normal  NECK: supple, thyroid normal size, non-tender, without nodularity LYMPH:  no palpable lymphadenopathy in the cervical, axillary or inguinal LUNGS: clear to auscultation and percussion with normal breathing effort HEART: regular rate & rhythm and no murmurs and no lower extremity edema ABDOMEN:abdomen soft, non-tender and normal bowel sounds MUSCULOSKELETAL:no cyanosis of digits and no clubbing  NEURO: alert & oriented x 3 with fluent speech, no focal motor/sensory deficits EXTREMITIES: No lower extremity edema  LABORATORY DATA:  I have reviewed the data as listed   Chemistry      Component Value Date/Time   NA 140 03/12/2016 2049   NA 142 06/09/2015 1222   K 3.9 03/12/2016 2049   K 4.3 06/09/2015 1222   CL 106 03/12/2016 2049   CO2 26 03/12/2016 2049   CO2 27 06/09/2015 1222   BUN 12 03/12/2016 2049   BUN 12.9 06/09/2015 1222   CREATININE 0.72 03/12/2016 2049   CREATININE 0.8 06/09/2015 1222      Component Value Date/Time   CALCIUM 9.8 03/12/2016 2049   CALCIUM 10.3 06/09/2015 1222   ALKPHOS 127 06/09/2015 1222   AST 17 06/09/2015 1222   ALT 23 06/09/2015 1222   BILITOT 0.28 06/09/2015 1222       Lab Results  Component Value Date   WBC 9.5 03/12/2016   HGB 13.9 03/12/2016   HCT 40.8 03/12/2016   MCV 86.6 03/12/2016   PLT 283 03/12/2016   NEUTROABS 5.1 06/09/2015    ASSESSMENT & PLAN:  Breast cancer of upper-outer quadrant of left female breast ([place]) Left lumpectomy 06/17/2015: Invasive ductal  carcinoma, grade 3, tumor size 1.6 cm, margins negative posterior 1 mm, 0/2 sentinel nodes, ER 100%, PR 60%, HER-2 negative ratio 0.67, Ki-67 30%, T1 cN0 stage IA, Oncotype DX score 25, 16% risk of recurrence with tamoxifen alone, completed adjuvant radiation 08/27/2015  started anastrozole 09/27/2015.  Current treatment: Adjuvant anastrozole 1 mg daily 5 years started 09/27/15  Anastrozole Toxicities: 1. Hot flashes twice a day with sweating: She tried gabapentin but it did not help her so she stopped it. I sent a prescription for Effexor XR. 2. Fatigue and decrease in interest in sex  Patient recently got engaged and she feels guilty about her lack of energy. Severe osteoporosis: T score -3 I recommended that she start bisphosphonate therapy. I discussed that on options including oral therapies and subcutaneous injections. She is going to think about it and decided. She feels that with exercise, calcium and vitamin D intake and tamoxifen her bones would actually improve.  F/U in 6 months to assess toxicities to anastrozole   I spent 25 minutes talking  to the patient of which more than half was spent in counseling and coordination of care.  No orders of the defined types were placed in this encounter.  The patient has a good understanding of the overall plan. she agrees with it. she will call with any problems that may develop before the next visit here.   Rulon Eisenmenger, MD 11/27/16

## 2016-12-02 ENCOUNTER — Telehealth: Payer: Self-pay | Admitting: Hematology and Oncology

## 2016-12-02 NOTE — Telephone Encounter (Signed)
S/W PT, GAVE APPT FOR 7/30 @ 11.30AM. ALSO, MAILED CALENDAR PER PT REQ.

## 2017-01-19 ENCOUNTER — Telehealth: Payer: Self-pay

## 2017-01-19 ENCOUNTER — Other Ambulatory Visit: Payer: Self-pay

## 2017-01-19 MED ORDER — ALENDRONATE-CHOLECALCIFEROL 70-5600 MG-UNIT PO TABS: 1.0000 | ORAL_TABLET | ORAL | 2 refills | Status: DC

## 2017-01-19 NOTE — Progress Notes (Signed)
Called pt to let her know that a script for fosamax was sent to her preferred pharmacy. Instructed pt to take 1 pill every 7 days with a full glass of water. Pt verbalized understanding and will see Dr.Gudena for follow up on 04/2017

## 2017-01-19 NOTE — Telephone Encounter (Signed)
Pt called about boniva vs fosamax vs xgeva. She has decided to go with fosamax. She knows Dr Lindi Adie is out of office today.  Asking that Dr Lindi Adie send Rx to [facility] on [address]

## 2017-01-19 NOTE — Telephone Encounter (Signed)
Will send her script and call to confirm with pt. Thank you.

## 2017-01-24 ENCOUNTER — Telehealth: Payer: Self-pay | Admitting: Emergency Medicine

## 2017-01-24 ENCOUNTER — Other Ambulatory Visit: Payer: Self-pay | Admitting: Emergency Medicine

## 2017-01-24 MED ORDER — ALENDRONATE SODIUM 70 MG PO TABS: 70.0000 mg | ORAL_TABLET | ORAL | 3 refills | Status: DC

## 2017-01-24 NOTE — Telephone Encounter (Signed)
Sent new Rx for Fosamax without D; advised pharmacy to discontinue the Fosamax plus; Per Dr Lindi Adie he would rather the patient take the Vitamin D separately.

## 2017-03-22 ENCOUNTER — Other Ambulatory Visit: Payer: Self-pay | Admitting: Obstetrics and Gynecology

## 2017-03-22 DIAGNOSIS — Z853 Personal history of malignant neoplasm of breast: Secondary | ICD-10-CM

## 2017-05-28 ENCOUNTER — Ambulatory Visit: Payer: BLUE CROSS/BLUE SHIELD | Admitting: Hematology and Oncology

## 2017-06-11 ENCOUNTER — Ambulatory Visit
Admission: RE | Admit: 2017-06-11 | Discharge: 2017-06-11 | Disposition: A | Discharge status: Home / Self Care | Payer: BLUE CROSS/BLUE SHIELD | Source: Ambulatory Visit | Attending: Obstetrics and Gynecology | Admitting: Obstetrics and Gynecology

## 2017-06-11 DIAGNOSIS — Z853 Personal history of malignant neoplasm of breast: Secondary | ICD-10-CM

## 2017-06-11 HISTORY — DX: Personal history of irradiation: Z92.3

## 2017-10-30 DIAGNOSIS — E041 Nontoxic single thyroid nodule: Secondary | ICD-10-CM

## 2017-10-30 HISTORY — DX: Nontoxic single thyroid nodule: E04.1

## 2018-03-26 ENCOUNTER — Other Ambulatory Visit: Payer: Self-pay | Admitting: Obstetrics and Gynecology

## 2018-03-26 ENCOUNTER — Other Ambulatory Visit: Payer: Self-pay | Admitting: Hematology and Oncology

## 2018-03-26 DIAGNOSIS — Z9889 Other specified postprocedural states: Secondary | ICD-10-CM

## 2018-05-08 ENCOUNTER — Telehealth: Payer: Self-pay | Admitting: Hematology and Oncology

## 2018-05-08 ENCOUNTER — Inpatient Hospital Stay: Payer: BLUE CROSS/BLUE SHIELD | Attending: Hematology and Oncology | Admitting: Hematology and Oncology

## 2018-05-08 DIAGNOSIS — M810 Age-related osteoporosis without current pathological fracture: Secondary | ICD-10-CM | POA: Diagnosis not present

## 2018-05-08 DIAGNOSIS — Z7983 Long term (current) use of bisphosphonates: Secondary | ICD-10-CM | POA: Insufficient documentation

## 2018-05-08 DIAGNOSIS — Z170 Estrogen receptor positive status [ER+]: Secondary | ICD-10-CM | POA: Insufficient documentation

## 2018-05-08 DIAGNOSIS — C50412 Malignant neoplasm of upper-outer quadrant of left female breast: Secondary | ICD-10-CM | POA: Diagnosis present

## 2018-05-08 MED ORDER — VITAMIN D 50 MCG (2000 UT) PO CAPS: 1.0000 | ORAL_CAPSULE | Freq: Every day | ORAL | Status: DC

## 2018-05-08 MED ORDER — ALENDRONATE SODIUM 70 MG PO TABS: 70.0000 mg | ORAL_TABLET | ORAL | 3 refills | Status: DC

## 2018-05-08 MED ORDER — TAMOXIFEN CITRATE 20 MG PO TABS: 20.0000 mg | ORAL_TABLET | Freq: Every day | ORAL | 3 refills | Status: DC

## 2018-05-08 NOTE — Telephone Encounter (Signed)
Gave patient avs and calendar of upcoming April appts.

## 2018-05-08 NOTE — Progress Notes (Signed)
Patient Care Team: [name], MD as PCP - General (Family Medicine) Molli Posey, MD as Consulting Physician (Obstetrics and Gynecology) Excell Seltzer, MD as Consulting Physician (General Surgery) Nicholas Lose, MD as Consulting Physician (Hematology and Oncology) Eppie Gibson, MD as Attending Physician (Radiation Oncology) Mauro Kaufmann, RN as Registered Nurse Rockwell Germany, RN as Registered Nurse Holley Bouche, NP as Nurse Practitioner (Nurse Practitioner) Sylvan Cheese, NP as Nurse Practitioner (Nurse Practitioner)  DIAGNOSIS:  Encounter Diagnosis  Name Primary?  . Malignant neoplasm of upper-outer quadrant of left breast in female, estrogen receptor positive ([place])     SUMMARY OF ONCOLOGIC HISTORY:   Breast cancer of upper-outer quadrant of left female breast ([place])   06/02/2015 Breast US    Left breast: hypoechoic mass at 130, 7 cm from the nipple measuring approximately 1.6 x 0.9 x 1.4 cm. There is a border along the superior margin which appears indistinct.       06/02/2015 Initial Biopsy    Left breast biopsy 1:30 position: Invasive ductal carcinoma with associated microcalcification, grade 1-2, ER+ (100%), PR+ (60%), HER-2 negative, Ki-67 30%, 1.6 cm tumor       06/03/2015 Clinical Stage    Stage IA: T1c N0      06/17/2015 Definitive Surgery    Left lumpectomy/SLNB ([facility]): Invasive ductal carcinoma, grade 3, tumor size 1.6 cm, HER2/neu repeated and remains negative (ratio 0.67),  margins negative posterior 1 mm, 0/2 sentinel nodes      06/17/2015 Pathologic Stage    Stage IA: T1c N0      06/17/2015 Oncotype testing    Score 25 (16% ROR)      08/02/2015 - 08/27/2015 Radiation Therapy    Adjuvant XRT Isidore Moos): Left Breast. 40.05 Gy in 15 fractions. Left Breast Boost. 10 Gy in 5 fractions.  Total dose: 50 Gy      08/30/2015 -  Anti-estrogen oral therapy    Anastrozole 1 mg daily. Switch to tamoxifen 10 mg daily July 2017 due to  severe osteoporosis T score -3      09/27/2015 Survivorship    Survivorship care plan completed and mailed to patient in lieu of in person visit       CHIEF COMPLIANT: Follow-up on tamoxifen therapy  INTERVAL HISTORY: Norma Robles is a 57 year old with above-mentioned history of breast cancer treated with lumpectomy radiation and is currently on antiestrogen therapy with tamoxifen.  She could not tolerate anastrozole.  She was noted to have severe osteoporosis and is currently on bisphosphonate therapy.  She also takes calcium and vitamin D.  She denies any lumps or nodules in the breast.  REVIEW OF SYSTEMS:   Constitutional: Denies fevers, chills or abnormal weight loss Eyes: Denies blurriness of vision Ears, nose, mouth, throat, and face: Denies mucositis or sore throat Respiratory: Denies cough, dyspnea or wheezes Cardiovascular: Denies palpitation, chest discomfort Gastrointestinal:  Denies nausea, heartburn or change in bowel habits Skin: Denies abnormal skin rashes Lymphatics: Denies new lymphadenopathy or easy bruising Neurological:Denies numbness, tingling or new weaknesses Behavioral/Psych: Mood is stable, no new changes  Extremities: No lower extremity edema Breast:  denies any pain or lumps or nodules in either breasts All other systems were reviewed with the patient and are negative.  I have reviewed the past medical history, past surgical history, social history and family history with the patient and they are unchanged from previous note.  ALLERGIES:  is allergic to codeine.  MEDICATIONS:  Current Outpatient Medications  Medication Sig  Dispense Refill  . alendronate (FOSAMAX) 70 MG tablet Take 1 tablet (70 mg total) by mouth once a week. Take with a full glass of water on an empty stomach. 15 tablet 3  . Cholecalciferol (VITAMIN D) 2000 units CAPS Take 1 capsule (2,000 Units total) by mouth daily. 30 capsule   . hydrochlorothiazide (MICROZIDE) 12.5 MG capsule as  needed.   0  . Sennosides (EQ LAXATIVE PO) Take by mouth as needed.    . tamoxifen (NOLVADEX) 20 MG tablet Take 1 tablet (20 mg total) by mouth daily. 90 tablet 3   No current facility-administered medications for this visit.     PHYSICAL EXAMINATION: ECOG PERFORMANCE STATUS: 1 - Symptomatic but completely ambulatory  Vitals:   05/08/18 0839  BP: (!) 150/85  Pulse: 72  Resp: 18  Temp: 98.4 F (36.9 C)  SpO2: 99%   Filed Weights   05/08/18 0839  Weight: 150 lb 8 oz (68.3 kg)    GENERAL:alert, no distress and comfortable SKIN: skin color, texture, turgor are normal, no rashes or significant lesions EYES: normal, Conjunctiva are pink and non-injected, sclera clear OROPHARYNX:no exudate, no erythema and lips, buccal mucosa, and tongue normal  NECK: supple, thyroid normal size, non-tender, without nodularity LYMPH:  no palpable lymphadenopathy in the cervical, axillary or inguinal LUNGS: clear to auscultation and percussion with normal breathing effort HEART: regular rate & rhythm and no murmurs and no lower extremity edema ABDOMEN:abdomen soft, non-tender and normal bowel sounds MUSCULOSKELETAL:no cyanosis of digits and no clubbing  NEURO: alert & oriented x 3 with fluent speech, no focal motor/sensory deficits EXTREMITIES: No lower extremity edema   LABORATORY DATA:  I have reviewed the data as listed CMP Latest Ref Rng & Units 03/12/2016 06/09/2015 07/03/2014  Glucose 65 - 99 mg/dL 108(H) 97 113(H)  BUN 6 - 20 mg/dL 12 12.9 9  Creatinine 0.44 - 1.00 mg/dL 0.72 0.8 0.70  Sodium 135 - 145 mmol/L 140 142 140  Potassium 3.5 - 5.1 mmol/L 3.9 4.3 4.7  Chloride 101 - 111 mmol/L 106 - 101  CO2 22 - 32 mmol/L 26 27 25   Calcium 8.9 - 10.3 mg/dL 9.8 10.3 10.2  Total Protein 6.4 - 8.3 g/dL - 7.6 8.1  Total Bilirubin 0.20 - 1.20 mg/dL - 0.28 0.3  Alkaline Phos 40 - 150 U/L - 127 119(H)  AST 5 - 34 U/L - 17 16  ALT 0 - 55 U/L - 23 16    Lab Results  Component Value Date   WBC 9.5  03/12/2016   HGB 13.9 03/12/2016   HCT 40.8 03/12/2016   MCV 86.6 03/12/2016   PLT 283 03/12/2016   NEUTROABS 5.1 06/09/2015    ASSESSMENT & PLAN:  Breast cancer of upper-outer quadrant of left female breast ([place]) Left lumpectomy 06/17/2015: Invasive ductal carcinoma, grade 3, tumor size 1.6 cm, margins negative posterior 1 mm, 0/2 sentinel nodes, ER 100%, PR 60%, HER-2 negative ratio 0.67, Ki-67 30%, T1 cN0 stage IA, Oncotype DX score 25, 16% risk of recurrence with tamoxifen alone, completed adjuvant radiation 08/27/2015  started anastrozole 09/27/2015.  Current treatment: Adjuvant tamoxifen 20 mg daily 5 years started 09/27/15  Tamoxifen toxicities: 1. Hot flashes: Intermittent, we previously prescribed Effexor but she never took it. 2. Fatigue and decrease in interest in sex  Severe osteoporosis: T score -3 She is currently on Fosamax 70 mg weekly along with calcium and vitamin D I ordered another bone density test to be done along with her mammograms  in August.  Breast cancer surveillance: 1.  Breast exam 05/08/2018: Benign  2. Mammogram 06/11/2017: Benign, breast density category C  Return to clinic in 1 year for follow-up  Orders Placed This Encounter  Procedures  . DG Bone Density    Standing Status:   Future    Standing Expiration Date:   06/12/2019    Scheduling Instructions:     Please do it at the same time as her mammogram    Order Specific Question:   Reason for exam:    Answer:   Osteoporosis    Order Specific Question:   Preferred imaging location?    Answer:   [facility]   The patient has a good understanding of the overall plan. she agrees with it. she will call with any problems that may develop before the next visit here.   Harriette Ohara, MD 05/08/18

## 2018-05-08 NOTE — Assessment & Plan Note (Signed)
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  Current treatment: Adjuvant tamoxifen 20 mg daily 5 years started 09/27/15  Tamoxifen toxicities: 1. Hot flashes twice a day with sweating: Improved on Effexor XR. 2. Fatigue and decrease in interest in sex  Patient recently got engaged and she feels guilty about her lack of energy.  Severe osteoporosis: T score -3 I recommended that she start bisphosphonate therapy.  She is currently on Fosamax 70 mg weekly along with calcium and vitamin D

## 2018-06-18 ENCOUNTER — Ambulatory Visit
Admission: RE | Admit: 2018-06-18 | Discharge: 2018-06-18 | Disposition: A | Discharge status: Home / Self Care | Payer: BLUE CROSS/BLUE SHIELD | Source: Ambulatory Visit | Attending: Hematology and Oncology | Admitting: Hematology and Oncology

## 2018-06-18 DIAGNOSIS — C50412 Malignant neoplasm of upper-outer quadrant of left female breast: Secondary | ICD-10-CM

## 2018-06-18 DIAGNOSIS — Z170 Estrogen receptor positive status [ER+]: Principal | ICD-10-CM

## 2018-06-18 DIAGNOSIS — Z9889 Other specified postprocedural states: Secondary | ICD-10-CM

## 2019-02-04 ENCOUNTER — Ambulatory Visit: Payer: BLUE CROSS/BLUE SHIELD | Admitting: Hematology and Oncology

## 2019-04-22 ENCOUNTER — Other Ambulatory Visit: Payer: Self-pay | Admitting: Hematology and Oncology

## 2019-04-22 DIAGNOSIS — Z853 Personal history of malignant neoplasm of breast: Secondary | ICD-10-CM

## 2019-05-01 ENCOUNTER — Telehealth: Payer: Self-pay | Admitting: Hematology and Oncology

## 2019-05-01 NOTE — Telephone Encounter (Signed)
Returned patient's phone call regarding rescheduling an appointment. 04/07 cancelled appointment has now been moved to 07/14.

## 2019-05-07 ENCOUNTER — Telehealth: Payer: Self-pay | Admitting: Hematology and Oncology

## 2019-05-07 NOTE — Assessment & Plan Note (Deleted)
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  Current treatment: Adjuvant tamoxifen 20 mg daily 5 years started 09/27/15  Tamoxifen toxicities: 1. Hot flashes: Intermittent, we previously prescribed Effexor but she never took it. 2. Fatigue and decrease in interest in sex  Severe osteoporosis:Currently on Fosamax 70 mg weekly along with calcium and vitamin D Bone density 06/18/2018: T score -3.1 (previous T score -3): Osteoporosis  Breast cancer surveillance: 1.  Breast exam 05/08/2018: Benign  2. Mammogram 06/18/2018: Benign, breast density category B  Return to clinic in 1 year for follow-up

## 2019-05-07 NOTE — Telephone Encounter (Signed)
I talk with patient regarding video visit and she had concerns regarding copay

## 2019-05-13 ENCOUNTER — Ambulatory Visit: Payer: BLUE CROSS/BLUE SHIELD | Admitting: Hematology and Oncology

## 2019-06-20 ENCOUNTER — Other Ambulatory Visit: Payer: Self-pay

## 2019-06-20 ENCOUNTER — Ambulatory Visit
Admission: RE | Admit: 2019-06-20 | Discharge: 2019-06-20 | Disposition: A | Discharge status: Home / Self Care | Payer: BC Managed Care – PPO | Source: Ambulatory Visit | Attending: Hematology and Oncology | Admitting: Hematology and Oncology

## 2019-06-20 DIAGNOSIS — Z853 Personal history of malignant neoplasm of breast: Secondary | ICD-10-CM

## 2020-05-10 ENCOUNTER — Other Ambulatory Visit: Payer: Self-pay | Admitting: Hematology and Oncology

## 2020-05-10 DIAGNOSIS — Z853 Personal history of malignant neoplasm of breast: Secondary | ICD-10-CM

## 2020-06-21 ENCOUNTER — Ambulatory Visit
Admission: RE | Admit: 2020-06-21 | Discharge: 2020-06-21 | Disposition: A | Discharge status: Home / Self Care | Payer: BC Managed Care – PPO | Source: Ambulatory Visit | Attending: Hematology and Oncology | Admitting: Hematology and Oncology

## 2020-06-21 ENCOUNTER — Other Ambulatory Visit: Payer: Self-pay

## 2020-06-21 DIAGNOSIS — Z853 Personal history of malignant neoplasm of breast: Secondary | ICD-10-CM

## 2022-10-30 DIAGNOSIS — C649 Malignant neoplasm of unspecified kidney, except renal pelvis: Secondary | ICD-10-CM

## 2022-10-30 DIAGNOSIS — D0512 Intraductal carcinoma in situ of left breast: Secondary | ICD-10-CM

## 2022-10-30 HISTORY — DX: Intraductal carcinoma in situ of left breast: D05.12

## 2022-10-30 HISTORY — DX: Malignant neoplasm of unspecified kidney, except renal pelvis: C64.9

## 2022-10-30 HISTORY — PX: MASTECTOMY: SHX3

## 2022-11-06 ENCOUNTER — Other Ambulatory Visit: Payer: Self-pay | Admitting: Obstetrics and Gynecology

## 2022-11-06 DIAGNOSIS — Z1231 Encounter for screening mammogram for malignant neoplasm of breast: Secondary | ICD-10-CM

## 2022-11-07 ENCOUNTER — Ambulatory Visit
Admission: RE | Admit: 2022-11-07 | Discharge: 2022-11-07 | Disposition: A | Discharge status: Home / Self Care | Payer: BC Managed Care – PPO | Source: Ambulatory Visit | Attending: Obstetrics and Gynecology | Admitting: Obstetrics and Gynecology

## 2022-11-07 DIAGNOSIS — Z1231 Encounter for screening mammogram for malignant neoplasm of breast: Secondary | ICD-10-CM

## 2022-11-08 ENCOUNTER — Other Ambulatory Visit: Payer: Self-pay | Admitting: Obstetrics and Gynecology

## 2022-11-08 DIAGNOSIS — R928 Other abnormal and inconclusive findings on diagnostic imaging of breast: Secondary | ICD-10-CM

## 2022-11-09 ENCOUNTER — Ambulatory Visit
Admission: RE | Admit: 2022-11-09 | Discharge: 2022-11-09 | Disposition: A | Discharge status: Home / Self Care | Payer: BC Managed Care – PPO | Source: Ambulatory Visit | Attending: Obstetrics and Gynecology | Admitting: Obstetrics and Gynecology

## 2022-11-09 DIAGNOSIS — R928 Other abnormal and inconclusive findings on diagnostic imaging of breast: Secondary | ICD-10-CM

## 2022-11-10 ENCOUNTER — Other Ambulatory Visit: Payer: Self-pay | Admitting: Obstetrics and Gynecology

## 2022-11-10 DIAGNOSIS — R921 Mammographic calcification found on diagnostic imaging of breast: Secondary | ICD-10-CM

## 2022-11-16 ENCOUNTER — Ambulatory Visit
Admission: RE | Admit: 2022-11-16 | Discharge: 2022-11-16 | Disposition: A | Discharge status: Home / Self Care | Payer: BC Managed Care – PPO | Source: Ambulatory Visit | Attending: Obstetrics and Gynecology | Admitting: Obstetrics and Gynecology

## 2022-11-16 ENCOUNTER — Other Ambulatory Visit: Payer: Self-pay | Admitting: Obstetrics and Gynecology

## 2022-11-16 DIAGNOSIS — R921 Mammographic calcification found on diagnostic imaging of breast: Secondary | ICD-10-CM

## 2022-11-16 HISTORY — PX: BREAST BIOPSY: SHX20

## 2022-11-20 ENCOUNTER — Telehealth: Payer: Self-pay | Admitting: Hematology and Oncology

## 2022-11-20 ENCOUNTER — Other Ambulatory Visit: Payer: Self-pay | Admitting: Surgery

## 2022-11-20 ENCOUNTER — Ambulatory Visit: Payer: Self-pay | Admitting: Surgery

## 2022-11-20 DIAGNOSIS — C50412 Malignant neoplasm of upper-outer quadrant of left female breast: Secondary | ICD-10-CM

## 2022-11-20 NOTE — Telephone Encounter (Signed)
Contacted patient to scheduled appointments. Left message with appointment details and a call back number if patient had any questions or could not accommodate the time we provided.

## 2022-11-20 NOTE — H&P (Signed)
Subjective   Chief Complaint: New Consultation (Breast Cancer)     History of Present Illness: Norma Robles is a 62 y.o. female who is seen today as an office consultation at the request of Dr. Tinzley Dalia Saras for evaluation of New Consultation (Breast Cancer) .    [name] who is a former patient of Dr. Excell Seltzer.  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  She underwent left lumpectomy and sentinel lymph node biopsy in August 2016.  Margins were negative.  2 lymph nodes were negative.  Oncotype testing was 25.  She underwent radiation and antiestrogen therapy with tamoxifen.  2 weeks ago, she underwent screening mammogram that revealed calcifications in the left breast.  She underwent further workup which revealed 2 adjacent groups of indeterminate calcifications in the lower outer quadrant of the left breast spanning a total of 2.8 cm.  The more anterior group measures 1.0 cm and the smaller posterior group measures 0.5 cm.  She had 2 biopsies of the larger area and one of the small area.  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  Prognostic panel is pending.  The patient was previously treated by Dr. Lindi Adie and Isidore Moos.   Review of Systems: A complete review of systems was obtained from the patient.  I have reviewed this information and discussed as appropriate with the patient.  See HPI as well for other ROS.  Review of Systems  Constitutional: Negative.   HENT: Negative.    Eyes: Negative.   Respiratory: Negative.    Cardiovascular: Negative.   Gastrointestinal: Negative.   Genitourinary: Negative.   Musculoskeletal: Negative.   Skin: Negative.   Neurological: Negative.   Endo/Heme/Allergies: Negative.   Psychiatric/Behavioral: Negative.        Medical History: Past Medical History:  Diagnosis Date   History of cancer    Hypertension     Patient Active  Problem List  Diagnosis   Malignant neoplasm of overlapping sites of left female breast (CMS-HCC)    Past Surgical History:  Procedure Laterality Date   CHOLECYSTECTOMY     TONSILLECTOMY       Allergies  Allergen Reactions   Codeine Nausea    Other reaction(s): Nausea Only  Other reaction(s): Nausea Only    Current Outpatient Medications on File Prior to Visit  Medication Sig Dispense Refill   amLODIPine (NORVASC) 5 MG tablet Take 1 tablet by mouth once daily     No current facility-administered medications on file prior to visit.    Family History  Problem Relation Age of Onset   Obesity Mother    High blood pressure (Hypertension) Mother    Diabetes Mother    High blood pressure (Hypertension) Father    Diabetes Sister    Diabetes Brother    Breast cancer Daughter      Social History   Tobacco Use  Smoking Status Former   Types: Cigarettes   Quit date: 1997   Years since quitting: 27.0  Smokeless Tobacco Never     Social History   Socioeconomic History   Marital status: Single  Tobacco Use   Smoking status: Former    Types: Cigarettes    Quit date: 1997    Years since quitting: 27.0   Smokeless tobacco: Never  Vaping Use   Vaping Use: Never used  Substance and Sexual Activity   Alcohol use: Not Currently   Drug use: Never  Objective:    Vitals:   11/20/22 1116 11/20/22 1120  BP: 132/78   Pulse: 90   Temp: 36.8 C (98.2 F)   SpO2: 97%   Weight: 75.3 kg (166 lb)   Height: 151.8 cm (4' 11.75")   PainSc:  0-No pain    Body mass index is 32.69 kg/m.  Physical Exam   Constitutional:  WDWN in NAD, conversant, no obvious deformities; lying in bed comfortably Eyes:  Pupils equal, round; sclera anicteric; moist conjunctiva; no lid lag HENT:  Oral mucosa moist; good dentition  Neck:  No masses palpated, trachea midline; no thyromegaly Lungs:  CTA bilaterally; normal respiratory effort Breasts:  symmetric, no nipple changes; no palpable  masses or lymphadenopathy on either side; well-healed circumareolar incision on the left.  Well-healed axillary incision on the left.  Healing biopsy sites in the lateral left breast CV:  Regular rate and rhythm; no murmurs; extremities well-perfused with no edema Abd:  +bowel sounds, soft, non-tender, no palpable organomegaly; no palpable hernias Musc: Normal gait; no apparent clubbing or cyanosis in extremities Lymphatic:  No palpable cervical or axillary lymphadenopathy Skin:  Warm, dry; no sign of jaundice Psychiatric - alert and oriented x 4; calm mood and affect   Labs, Imaging and Diagnostic Testing: Diagnosis 1. Breast, left, needle core biopsy, lower outer anterior (coil) - DUCTAL CARCINOMA IN SITU, HIGH GRADE - NECROSIS: PRESENT - CALCIFICATIONS: PRESENT - DCIS LENGTH: 0.2 CM 2. Breast, left, needle core biopsy, lower outer posterior (ribbon) - DUCTAL CARCINOMA IN SITU, INTERMEDIATE TO HIGH GRADE - NECROSIS: PRESENT - CALCIFICATIONS: NOT IDENTIFIED - DCIS LENGTH: 0.2 CM 3. Breast, left, needle core biopsy, upper outer (x) - DUCTAL CARCINOMA IN SITU, HIGH GRADE - NECROSIS: PRESENT - CALCIFICATIONS: PRESENT - DCIS LENGTH: 0.2 CM Diagnosis Note 1. , 2 and 3. Dr. Arby Barrette reviewed the case and concurs with the above diagnosis. A breast prognostic profile (ER and PR) is pending and will be reported in an addendum. [facility] was notified on 11/17/2022. Norma Folds MD Pathologist, Electronic Signature (Case signed 11/17/2022)  CLINICAL DATA:  Recall from screening mammography, calcifications involving the LEFT breast. Personal history of malignant LEFT breast lumpectomy in 2016 with adjuvant radiation therapy.   EXAM: DIGITAL DIAGNOSTIC UNILATERAL LEFT MAMMOGRAM WITH CAD   TECHNIQUE: Left digital diagnostic mammography was performed.   COMPARISON:  Previous exam(s).   ACR Breast Density Category b: There are scattered areas  of fibroglandular density.   FINDINGS: Spot magnification CC and MLO views of the calcifications and a full field mediolateral view were obtained.   The calcifications in the UPPER OUTER QUADRANT at anterior depth questioned on screening mammography represents coarse linear calcifications. On the screening tomosynthesis images, some of the calcifications had a parallel "tram track" appearance, and are therefore likely arterial.   There are 2 adjacent groups of calcifications involving the LOWER OUTER QUADRANT at anterior depth which are more conspicuous on the magnification views. The larger group spans approximately 1.0 cm and is located anterior and lateral to the smaller group which spans approximately 0.5 cm. In total, the calcifications extend over a 2.8 cm length.   IMPRESSION: Two adjacent groups of indeterminate calcifications in the LOWER OUTER QUADRANT of the LEFT breast which span in total 2.8 cm. The larger more anteriorly located group measures 1.0 cm and the smaller more posteriorly located group measures 0.5 cm.   RECOMMENDATION: Stereotactic tomosynthesis core needle biopsy (x 2) of the 2 adjacent groups of  calcifications in the LOWER OUTER QUADRANT.   I have discussed the findings and recommendations with the patient. The stereotactic tomosynthesis core needle biopsy procedure was discussed with the patient and her questions were answered. She wishes to proceed with the biopsy. She will be contacted by the schedulers at the [facility] in order to schedule the procedure.   BI-RADS CATEGORY  4: Suspicious.     Electronically Signed   By: Evangeline Dakin M.D.   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  We recommend MRI to determine extent of disease.  She will likely need to have a left mastectomy.  At this time, there are no indications for sentinel lymph node biopsy with the absence of invasive disease.  Will also evaluate the right breast.  We will refer her to plastic surgery to discuss options for reconstruction.  We will also refer her back to her oncologist for further discussion for adjuvant treatment.  There is likely no role for radiation since she already had radiation to this left breast.  We will discuss her case further breast conference this week.    Hong Moring Jearld Adjutant, MD  11/20/2022 11:58 AM

## 2022-11-22 ENCOUNTER — Other Ambulatory Visit: Payer: Self-pay | Admitting: *Deleted

## 2022-11-22 ENCOUNTER — Other Ambulatory Visit: Payer: Self-pay | Admitting: Genetic Counselor

## 2022-11-22 DIAGNOSIS — Z1379 Encounter for other screening for genetic and chromosomal anomalies: Secondary | ICD-10-CM

## 2022-11-22 DIAGNOSIS — D0512 Intraductal carcinoma in situ of left breast: Secondary | ICD-10-CM

## 2022-11-23 ENCOUNTER — Inpatient Hospital Stay: Payer: BC Managed Care – PPO | Attending: Genetic Counselor | Admitting: Genetic Counselor

## 2022-11-23 ENCOUNTER — Other Ambulatory Visit: Payer: Self-pay

## 2022-11-23 ENCOUNTER — Encounter: Payer: Self-pay | Admitting: Genetic Counselor

## 2022-11-23 ENCOUNTER — Inpatient Hospital Stay: Payer: BC Managed Care – PPO

## 2022-11-23 DIAGNOSIS — M810 Age-related osteoporosis without current pathological fracture: Secondary | ICD-10-CM | POA: Insufficient documentation

## 2022-11-23 DIAGNOSIS — Z170 Estrogen receptor positive status [ER+]: Secondary | ICD-10-CM | POA: Insufficient documentation

## 2022-11-23 DIAGNOSIS — Z803 Family history of malignant neoplasm of breast: Secondary | ICD-10-CM | POA: Diagnosis not present

## 2022-11-23 DIAGNOSIS — Z8042 Family history of malignant neoplasm of prostate: Secondary | ICD-10-CM

## 2022-11-23 DIAGNOSIS — D0512 Intraductal carcinoma in situ of left breast: Secondary | ICD-10-CM | POA: Diagnosis not present

## 2022-11-23 DIAGNOSIS — Z8049 Family history of malignant neoplasm of other genital organs: Secondary | ICD-10-CM

## 2022-11-23 DIAGNOSIS — Z1379 Encounter for other screening for genetic and chromosomal anomalies: Secondary | ICD-10-CM

## 2022-11-23 DIAGNOSIS — Z79811 Long term (current) use of aromatase inhibitors: Secondary | ICD-10-CM | POA: Insufficient documentation

## 2022-11-23 DIAGNOSIS — C50412 Malignant neoplasm of upper-outer quadrant of left female breast: Secondary | ICD-10-CM | POA: Insufficient documentation

## 2022-11-23 DIAGNOSIS — Z923 Personal history of irradiation: Secondary | ICD-10-CM | POA: Insufficient documentation

## 2022-11-23 LAB — GENETIC SCREENING ORDER

## 2022-11-23 NOTE — Progress Notes (Signed)
REFERRING PROVIDER: Nicholas Lose, MD [place],  [place] [id number]  PRIMARY PROVIDER:  [name], MD  PRIMARY REASON FOR VISIT:  Encounter Diagnoses  Name Primary?   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 female, was seen for a [place] cancer genetics consultation at the request of Dr. Lindi Adie due to a personal and family history of breast cancer.  Norma Robles presents to clinic today to discuss the possibility of a hereditary predisposition to cancer, to discuss genetic testing, and to further clarify her future cancer risks, as well as potential cancer risks for family members.   In 2016, at the age of 89, Norma Robles was diagnosed with invasive ductal carcinoma of the left breast (ER+/PR+/HER2-). The treatment plan included breast conserving surgery, adjuvant radiation, and anti-estrogens.  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  The treatment plan is pending.   CANCER HISTORY:  Oncology History  Breast cancer of upper-outer quadrant of left female breast ([place])  06/02/2015 Breast US   Left breast: hypoechoic mass at 130, 7 cm from the nipple measuring approximately 1.6 x 0.9 x 1.4 cm. There is a border along the superior margin which appears indistinct.    06/02/2015 Initial Biopsy   Left breast biopsy 1:30 position: Invasive ductal carcinoma with associated microcalcification, grade 1-2, ER+ (100%), PR+ (60%), HER-2 negative, Ki-67 30%, 1.6 cm tumor    06/03/2015 Clinical Stage   Stage IA: T1c N0   06/17/2015 Definitive Surgery   Left lumpectomy/SLNB ([facility]): Invasive ductal carcinoma, grade 3,  tumor size 1.6 cm, HER2/neu repeated and remains negative (ratio 0.67),  margins negative posterior 1 mm, 0/2 sentinel nodes   06/17/2015 Pathologic Stage   Stage IA: T1c N0   06/17/2015 Oncotype testing   Score 25 (16% ROR)   08/02/2015 - 08/27/2015 Radiation Therapy   Adjuvant XRT Isidore Moos): Left Breast. 40.05 Gy in 15 fractions. Left Breast Boost. 10 Gy in 5 fractions.  Total dose: 50 Gy   08/30/2015 -  Anti-estrogen oral therapy   Anastrozole 1 mg daily. Switch to tamoxifen 10 mg daily July 2017 due to severe osteoporosis T score -3   09/27/2015 Survivorship   Survivorship care plan completed and mailed to patient in lieu of in person visit      RISK FACTORS:  Colonoscopy: yes;  every 5 years; less than 10 colon polyps . Hysterectomy: no.  Ovaries intact: yes.  Menarche was at age 44.  First live birth at age 46.  OCP use for more than 20 years.  HRT use: 0 years. Dermatology screening: annually.  Dr. Renda Rolls  Past Medical History:  Diagnosis Date   Breast cancer [facility]) 2016   Left Breast Cancer   Breast cancer of upper-outer quadrant of left female breast ([place]) 06/04/2015   Personal history of radiation therapy 2016   Left Breast Cancer    Past Surgical History:  Procedure Laterality Date   BREAST BIOPSY Left 06/02/2015   BREAST BIOPSY Left 11/16/2022   MM LT BREAST BX W LOC DEV 1ST LESION IMAGE BX SPEC STEREO GUIDE 11/16/2022  [facility]   BREAST BIOPSY Left 11/16/2022   MM LT BREAST BX W LOC DEV EA AD LESION IMG BX SPEC STEREO GUIDE 11/16/2022 [facility]   BREAST BIOPSY Left 11/16/2022   MM LT BREAST BX W LOC DEV EA AD LESION IMG BX SPEC STEREO GUIDE 11/16/2022 [facility]MOGR[facility]   BREAST LUMPECTOMY Left 06/17/2015   BREAST LUMPECTOMY WITH RADIOACTIVE SEED AND SENTINEL LYMPH NODE BIOPSY Left 06/17/2015   Procedure: RADIOACTIVE SEED LOCALIZATION LEFT BREAST LUMPECTOMY AND LEFT AXILLARY SENTINEL LYMPH NODE BIOPSY;  Surgeon: Excell Seltzer, MD;  Location:  [place];  Service: General;  Laterality: Left;   CHOLECYSTECTOMY     TONSILLECTOMY     UTERINE FIBROID EMBOLIZATION       FAMILY HISTORY:  We obtained a detailed, 4-generation family history.  Significant diagnoses are listed below: Family History  Problem Relation Age of Onset   Uterine cancer Mother 55   Skin cancer Mother        non-melanoma; on hand   Prostate cancer Brother 61   Breast cancer Maternal Aunt 75       mets   Cancer Paternal Aunt        ? breast cancer; dx ~50   Breast cancer Daughter 36       neg GT   Prostate cancer Cousin 9      Norma Robles reported that both of her daughters had negative genetic testing (in 2020 or later).  Norma Robles is unaware of other previous family history of genetic testing for hereditary cancer risks.   She is unaware of any Ashkenazi Jewish ancestry. There is no known consanguinity.  GENETIC COUNSELING ASSESSMENT: Norma Robles is a 62 y.o. female with a personal and family history of cancer which is somewhat suggestive of a hereditary cancer syndrome and predisposition to cancer given the presence of related cancers in the family. We, therefore, discussed and recommended the following at today's visit.   DISCUSSION: We discussed that 5 - 10% of cancer is hereditary, with most cases of hereditary breast cancer associated with mutations in BRCA1/2.  There are other genes that can be associated with hereditary breast and prostate cancer syndromes.  Type of cancer risk and level of risk are gene-specific. We discussed that testing is beneficial for several reasons including knowing how to follow individuals after completing their treatment, identifying whether potential treatment options would be beneficial, and understanding if other family members could be at risk for cancer and allowing them to undergo genetic testing.   We reviewed the characteristics, features and inheritance patterns of hereditary cancer syndromes. We  also discussed genetic testing, including the appropriate family members to test, the process of testing, insurance coverage and turn-around-time for results. We discussed the implications of a negative, positive and/or variant of uncertain significant result. We discussed that while her daughters are reportedly negative, it is still possible for Norma Robles to have a positive result.  In order to get genetic test results in a timely manner so that Norma Robles can use these genetic test results for surgical decisions, we recommended Norma Robles pursue genetic testing for the [facility] Breast Cancer STAT Panel.  The STAT Breast cancer panel offered by [facility] includes sequencing and rearrangement analysis for the following 9 genes:  ATM, BRCA1, BRCA2, CDH1, CHEK2, PALB2, PTEN, STK11 and TP53.   Once complete, we recommend Norma Robles pursue reflex genetic testing to a more comprehensive gene panel.   The Multi-Cancer + RNA Panel offered  by [facility] includes sequencing and/or deletion/duplication analysis of the following 70 genes:  AIP*, ALK, APC*, ATM*, AXIN2*, BAP1*, BARD1*, BLM*, BMPR1A*, BRCA1*, BRCA2*, BRIP1*, CDC73*, CDH1*, CDK4, CDKN1B*, CDKN2A, CHEK2*, CTNNA1*, DICER1*, EPCAM (del/dup only), EGFR, FH*, FLCN*, GREM1 (promoter dup only), HOXB13, KIT, LZTR1, MAX*, MBD4, MEN1*, MET, MITF, MLH1*, MSH2*, MSH3*, MSH6*, MUTYH*, NF1*, NF2*, NTHL1*, PALB2*, PDGFRA, PMS2*, POLD1*, POLE*, POT1*, PRKAR1A*, PTCH1*, PTEN*, RAD51C*, RAD51D*, RB1*, RET, SDHA* (sequencing only), SDHAF2*, SDHB*, SDHC*, SDHD*, SMAD4*, SMARCA4*, SMARCB1*, SMARCE1*, STK11*, SUFU*, TMEM127*, TP53*, TSC1*, TSC2*, VHL*. RNA analysis is performed for * genes.  Based on Norma Robles's personal history of breast cancer and family history of breast/prostate cancer, she meets medical criteria for genetic testing. Despite that she meets criteria, she may still have an out of pocket cost. We discussed that if her out of pocket cost for testing is over $100, the  laboratory should contact them to discuss self-pay prices, patient pay assistance programs, if applicable, and other billing options.   PLAN: After considering the risks, benefits, and limitations, Norma Robles provided informed consent to pursue genetic testing and the blood sample was sent to [facility] for analysis of the STAT and Multi-Cancer +RNA Panels. Results should be available within approximately 1-2 weeks' time, at which point they will be disclosed by telephone to Norma Robles, as will any additional recommendations warranted by these results. Norma Robles will receive a summary of her genetic counseling visit and a copy of her results once available. This information will also be available in Epic.   Norma Robles questions were answered to her satisfaction today. Our contact information was provided should additional questions or concerns arise. Thank you for the referral and allowing [place] to share in the care of your patient.   Norma Robles M. Joette Catching, [place], [facility] Genetic Counselor [email or web address] (P) [phone]  The patient was seen for a total of 40 minutes in face-to-face genetic counseling. Patient was seen alone.  Dr. Lindi Adie was available to discuss this case as needed.  _______________________________________________________________________ For Office Staff:  Number of people involved in session: 1 Was an Intern/ student involved with case: no

## 2022-11-28 NOTE — Assessment & Plan Note (Signed)
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   Current treatment: Adjuvant tamoxifen 20 mg daily 5 years started 09/27/15   Tamoxifen toxicities: 1. Hot flashes: Intermittent, we previously prescribed Effexor but she never took it. 2. Fatigue and decrease in interest in sex   Severe osteoporosis: T score -3 She is currently on Fosamax 70 mg weekly along with calcium and vitamin D   Recurrence: DCIS 1.  Mammogram 11/07/22: Left Breast Calcs 2. 11/16/22: Biopsies X 3: IG-HG DCIS ER/PR Positive   Patient has a breast MRI coming up on 12/01/2022. Dr. Georgette Dover will evaluate this and decide if she can undergo a lumpectomy versus a mastectomy. She did not want to undergo muscle flap reconstruction and might want to consider and might want to consider seeing a microvascular surgeon like Dr. Veda Canning for a D IEP flap if she has to undergo mastectomy.  Return to clinic after surgery to discuss pathology report.

## 2022-11-29 ENCOUNTER — Other Ambulatory Visit: Payer: Self-pay

## 2022-11-29 ENCOUNTER — Inpatient Hospital Stay (HOSPITAL_BASED_OUTPATIENT_CLINIC_OR_DEPARTMENT_OTHER): Payer: BC Managed Care – PPO | Admitting: Hematology and Oncology

## 2022-11-29 VITALS — BP 132/77 | HR 82 | Temp 97.3°F | Resp 16 | Wt 166.3 lb

## 2022-11-29 DIAGNOSIS — C50412 Malignant neoplasm of upper-outer quadrant of left female breast: Secondary | ICD-10-CM

## 2022-11-29 DIAGNOSIS — Z923 Personal history of irradiation: Secondary | ICD-10-CM | POA: Diagnosis not present

## 2022-11-29 DIAGNOSIS — Z79811 Long term (current) use of aromatase inhibitors: Secondary | ICD-10-CM | POA: Diagnosis not present

## 2022-11-29 DIAGNOSIS — Z170 Estrogen receptor positive status [ER+]: Secondary | ICD-10-CM | POA: Diagnosis not present

## 2022-11-29 DIAGNOSIS — M810 Age-related osteoporosis without current pathological fracture: Secondary | ICD-10-CM | POA: Diagnosis not present

## 2022-11-29 NOTE — Progress Notes (Signed)
Patient Care Team: [name], MD as PCP - General (Family Medicine) Molli Posey, MD as Consulting Physician (Obstetrics and Gynecology) Nicholas Lose, MD as Consulting Physician (Hematology and Oncology) Eppie Gibson, MD as Attending Physician (Radiation Oncology) Mauro Kaufmann, RN as Oncology Nurse Navigator Rockwell Germany, RN as Oncology Nurse Navigator  DIAGNOSIS:  Encounter Diagnosis  Name Primary?   Malignant neoplasm of upper-outer quadrant of left breast in female, estrogen receptor positive ([place]) Yes    SUMMARY OF ONCOLOGIC HISTORY: Oncology History  Breast cancer of upper-outer quadrant of left female breast ([place])  06/02/2015 Breast US   Left breast: hypoechoic mass at 130, 7 cm from the nipple measuring approximately 1.6 x 0.9 x 1.4 cm. There is a border along the superior margin which appears indistinct.    06/02/2015 Initial Biopsy   Left breast biopsy 1:30 position: Invasive ductal carcinoma with associated microcalcification, grade 1-2, ER+ (100%), PR+ (60%), HER-2 negative, Ki-67 30%, 1.6 cm tumor    06/03/2015 Clinical Stage   Stage IA: T1c N0   06/17/2015 Definitive Surgery   Left lumpectomy/SLNB ([facility]): Invasive ductal carcinoma, grade 3, tumor size 1.6 cm, HER2/neu repeated and remains negative (ratio 0.67),  margins negative posterior 1 mm, 0/2 sentinel nodes   06/17/2015 Pathologic Stage   Stage IA: T1c N0   06/17/2015 Oncotype testing   Score 25 (16% ROR)   08/02/2015 - 08/27/2015 Radiation Therapy   Adjuvant XRT Isidore Moos): Left Breast. 40.05 Gy in 15 fractions. Left Breast Boost. 10 Gy in 5 fractions.  Total dose: 50 Gy   08/30/2015 -  Anti-estrogen oral therapy   Anastrozole 1 mg daily. Switch to tamoxifen 10 mg daily July 2017 due to severe osteoporosis T score -3   09/27/2015 Survivorship   Survivorship care plan completed and mailed to patient in lieu of in person visit   11/16/2022 Relapse/Recurrence   Mammogram detected left  breast calcifications which on biopsy came back as intermediate to high-grade DCIS.  She had 3 biopsies all of which were ER/PR positive.     CHIEF COMPLIANT: Reoccurrence breast cancer  INTERVAL HISTORY: Norma Robles is a 62 year old with above-mentioned history of breast cancer treated with lumpectomy radiation and i previously took tamoxifen until 2019.  She could not tolerate anastrozole previously.. She presents to the clinic for a follow-up to discuss results of recent mammogram and biopsy showing DCIS. She states that she still sore where she she had the biopsy done on the left side breast.    ALLERGIES:  is allergic to codeine.  MEDICATIONS:  Current Outpatient Medications  Medication Sig Dispense Refill   alendronate (FOSAMAX) 70 MG tablet Take 1 tablet (70 mg total) by mouth once a week. Take with a full glass of water on an empty stomach. 15 tablet 3   Cholecalciferol (VITAMIN D) 2000 units CAPS Take 1 capsule (2,000 Units total) by mouth daily. 30 capsule    hydrochlorothiazide (MICROZIDE) 12.5 MG capsule as needed.   0   Sennosides (EQ LAXATIVE PO) Take by mouth as needed.     tamoxifen (NOLVADEX) 20 MG tablet Take 1 tablet (20 mg total) by mouth daily. 90 tablet 3   No current facility-administered medications for this visit.    PHYSICAL EXAMINATION: ECOG PERFORMANCE STATUS: 1 - Symptomatic but completely ambulatory  Vitals:   11/29/22 0953  BP: 132/77  Pulse: 82  Resp: 16  Temp: (!) 97.3 F (36.3 C)  SpO2: 100%   Filed Weights   11/29/22  0962  Weight: 166 lb 4.8 oz (75.4 kg)      LABORATORY DATA:  I have reviewed the data as listed    Latest Ref Rng & Units 03/12/2016    8:49 PM 06/09/2015   12:22 PM 07/03/2014    8:17 PM  CMP  Glucose 65 - 99 mg/dL 108  97  113   BUN 6 - 20 mg/dL 12  12.9  9   Creatinine 0.44 - 1.00 mg/dL 0.72  0.8  0.70   Sodium 135 - 145 mmol/L 140  142  140   Potassium 3.5 - 5.1 mmol/L 3.9  4.3  4.7   Chloride 101 - 111 mmol/L 106    101   CO2 22 - 32 mmol/L '26  27  25   '$ Calcium 8.9 - 10.3 mg/dL 9.8  10.3  10.2   Total Protein 6.4 - 8.3 g/dL  7.6  8.1   Total Bilirubin 0.20 - 1.20 mg/dL  0.28  0.3   Alkaline Phos 40 - 150 U/L  127  119   AST 5 - 34 U/L  17  16   ALT 0 - 55 U/L  23  16     Lab Results  Component Value Date   WBC 9.5 03/12/2016   HGB 13.9 03/12/2016   HCT 40.8 03/12/2016   MCV 86.6 03/12/2016   PLT 283 03/12/2016   NEUTROABS 5.1 06/09/2015    ASSESSMENT & PLAN:  Breast cancer of upper-outer quadrant of left female breast ([place]) Left lumpectomy 06/17/2015: Invasive ductal carcinoma, grade 3, tumor size 1.6 cm, margins negative posterior 1 mm, 0/2 sentinel nodes, ER 100%, PR 60%, HER-2 negative ratio 0.67, Ki-67 30%, T1 cN0 stage IA, Oncotype DX score 25, 16% risk of recurrence with tamoxifen alone, completed adjuvant radiation 08/27/2015   started anastrozole 09/27/2015.   Current treatment: Adjuvant tamoxifen 20 mg daily 5 years started 09/27/15   Tamoxifen toxicities: 1. Hot flashes: Intermittent, we previously prescribed Effexor but she never took it. 2. Fatigue and decrease in interest in sex   Severe osteoporosis: T score -3 She is currently on Fosamax 70 mg weekly along with calcium and vitamin D   Recurrence: DCIS 1.  Mammogram 11/07/22: Left Breast Calcs 2. 11/16/22: Biopsies X 3: IG-HG DCIS ER/PR Positive   Patient has a breast MRI coming up on 12/01/2022. Dr. Georgette Dover will evaluate this and decide if she can undergo a lumpectomy versus a mastectomy. She did not want to undergo muscle flap reconstruction and might want to consider and might want to consider seeing a microvascular surgeon like Dr. Veda Canning for a D IEP flap if she has to undergo mastectomy. Patient's daughter is also one of my patients.  Return to clinic after surgery to discuss pathology report.   No orders of the defined types were placed in this encounter.  The patient has a good understanding of the overall plan. she  agrees with it. she will call with any problems that may develop before the next visit here. Total time spent: 30 mins including face to face time and time spent for planning, charting and co-ordination of care   Harriette Ohara, MD 11/29/22    I Gardiner Coins am acting as a Education administrator for [employer]  I have reviewed the above documentation for accuracy and completeness, and I agree with the above.

## 2022-11-30 ENCOUNTER — Encounter: Payer: Self-pay | Admitting: *Deleted

## 2022-12-01 ENCOUNTER — Ambulatory Visit
Admission: RE | Admit: 2022-12-01 | Discharge: 2022-12-01 | Disposition: A | Discharge status: Home / Self Care | Payer: BC Managed Care – PPO | Source: Ambulatory Visit | Attending: Surgery | Admitting: Surgery

## 2022-12-01 DIAGNOSIS — Z170 Estrogen receptor positive status [ER+]: Secondary | ICD-10-CM

## 2022-12-01 MED ORDER — GADOPICLENOL 0.5 MMOL/ML IV SOLN
7.0000 mL | Freq: Once | INTRAVENOUS | Status: AC | PRN
  Administered 2022-12-01: 7 mL via INTRAVENOUS

## 2022-12-04 ENCOUNTER — Inpatient Hospital Stay: Payer: BC Managed Care – PPO | Attending: Genetic Counselor | Admitting: Licensed Clinical Social Worker

## 2022-12-04 NOTE — Progress Notes (Signed)
[place] Work  Clinical Social Work was referred by new patient protocol for assessment of psychosocial needs.  Clinical Social Worker contacted patient by phone  to offer support and assess for needs.   Pt reports strong social support and no resource/ SDOH needs. She is still working. CSW informed patient of the support team and support services at [facility].  CSW encouraged patient to call with any questions or concerns.     [place], [place] Worker [employer]

## 2022-12-05 ENCOUNTER — Telehealth: Payer: Self-pay | Admitting: *Deleted

## 2022-12-05 ENCOUNTER — Encounter: Payer: Self-pay | Admitting: *Deleted

## 2022-12-05 MED ORDER — TAMOXIFEN CITRATE 20 MG PO TABS: 20.0000 mg | ORAL_TABLET | Freq: Every day | ORAL | 3 refills | Status: DC

## 2022-12-05 NOTE — Telephone Encounter (Signed)
Spoke with patient regarding next steps.  Dr. Lindi Adie and Dr. Georgette Dover have discussed starting on tamoxifen then re checking MRI in 3-6 months due to the large hematoma.  Dr. Lindi Adie agrees with this plan. Rx for tamoxifen called into her pharmacy and she is aware.

## 2022-12-07 ENCOUNTER — Encounter: Payer: Self-pay | Admitting: Genetic Counselor

## 2022-12-07 ENCOUNTER — Telehealth: Payer: Self-pay | Admitting: Genetic Counselor

## 2022-12-07 DIAGNOSIS — Z1379 Encounter for other screening for genetic and chromosomal anomalies: Secondary | ICD-10-CM | POA: Insufficient documentation

## 2022-12-07 NOTE — Telephone Encounter (Signed)
Revealed negative genetic testing on the [facility] STAT panel.  Informed her that we will call once the remainder of testing is completed.  We will also let her care team know of this result.

## 2022-12-12 ENCOUNTER — Telehealth: Payer: Self-pay | Admitting: Genetic Counselor

## 2022-12-12 ENCOUNTER — Ambulatory Visit: Payer: Self-pay | Admitting: Genetic Counselor

## 2022-12-12 DIAGNOSIS — Z1379 Encounter for other screening for genetic and chromosomal anomalies: Secondary | ICD-10-CM

## 2022-12-12 DIAGNOSIS — Z170 Estrogen receptor positive status [ER+]: Secondary | ICD-10-CM

## 2022-12-12 NOTE — Telephone Encounter (Signed)
Revealed negative pan-cancer panel with VUS in LZTR1 and NTHL1.

## 2022-12-12 NOTE — Progress Notes (Signed)
HPI:   Ms. Cromwell was previously seen in the [place] clinic due to a personal and family history of breast cancer and concerns regarding a hereditary predisposition to cancer. Please refer to our prior cancer genetics clinic note for more information regarding our discussion, assessment and recommendations, at the time. Ms. Shilts recent genetic test results were disclosed to her, as were recommendations warranted by these results. These results and recommendations are discussed in more detail below.  CANCER HISTORY:  In 2016, at the age of 33, Ms. Allan was diagnosed with invasive ductal carcinoma of the left breast (ER+/PR+/HER2-). The treatment plan included breast conserving surgery, adjuvant radiation, and anti-estrogens.  In 2023, at the age of 66, Ms. 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  The treatment plan is pending.    Oncology History  Breast cancer of upper-outer quadrant of left female breast ([place])  06/02/2015 Breast US   Left breast: hypoechoic mass at 130, 7 cm from the nipple measuring approximately 1.6 x 0.9 x 1.4 cm. There is a border along the superior margin which appears indistinct.    06/02/2015 Initial Biopsy   Left breast biopsy 1:30 position: Invasive ductal carcinoma with associated microcalcification, grade 1-2, ER+ (100%), PR+ (60%), HER-2 negative, Ki-67 30%, 1.6 cm tumor    06/03/2015 Clinical Stage   Stage IA: T1c N0   06/17/2015 Definitive Surgery   Left lumpectomy/SLNB ([facility]): Invasive ductal carcinoma, grade 3, tumor size 1.6 cm, HER2/neu repeated and remains negative (ratio 0.67),  margins negative posterior 1 mm, 0/2 sentinel nodes   06/17/2015 Pathologic Stage   Stage IA: T1c N0   06/17/2015 Oncotype testing   Score 25 (16% ROR)   08/02/2015 - 08/27/2015 Radiation Therapy    Adjuvant XRT Isidore Moos): Left Breast. 40.05 Gy in 15 fractions. Left Breast Boost. 10 Gy in 5 fractions.  Total dose: 50 Gy   08/30/2015 -  Anti-estrogen oral therapy   Anastrozole 1 mg daily. 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  She had 3 biopsies all of which were ER/PR positive.   12/08/2022 Genetic Testing   Negative genetic testing on the [facility] Multi-Cancer +RNA Panel.  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 Report date is 12/08/2022.   The Multi-Cancer + RNA Panel offered by [facility] includes sequencing and/or deletion/duplication analysis of the following 70 genes:  AIP*, ALK, APC*, ATM*, AXIN2*, BAP1*, BARD1*, BLM*, BMPR1A*, BRCA1*, BRCA2*, BRIP1*, CDC73*, CDH1*, CDK4, CDKN1B*, CDKN2A, CHEK2*, CTNNA1*, DICER1*, EPCAM (del/dup only), EGFR, FH*, FLCN*, GREM1 (promoter dup only), HOXB13, KIT, LZTR1, MAX*, MBD4, MEN1*, MET, MITF, MLH1*, MSH2*, MSH3*, MSH6*, MUTYH*, NF1*, NF2*, NTHL1*, PALB2*, PDGFRA, PMS2*, POLD1*, POLE*, POT1*, PRKAR1A*, PTCH1*, PTEN*, RAD51C*, RAD51D*, RB1*, RET, SDHA* (sequencing only), SDHAF2*, SDHB*, SDHC*, SDHD*, SMAD4*, SMARCA4*, SMARCB1*, SMARCE1*, STK11*, SUFU*, TMEM127*, TP53*, TSC1*, TSC2*, VHL*. RNA analysis is performed for * genes.     FAMILY HISTORY:  We obtained a detailed, 4-generation family history.  Significant diagnoses are listed below:      Family History  Problem Relation Age of Onset   Uterine  cancer Mother 33   Skin cancer Mother          non-melanoma; on hand   Prostate cancer Brother 58   Breast cancer Maternal Aunt 75        mets   Cancer Paternal Aunt          ? breast cancer; dx ~50   Breast cancer Daughter 32         neg GT   Prostate cancer Cousin 52         Ms. Patient reported that both of her daughters had negative genetic testing (in 2020 or later).  Ms. Colavita is unaware of other previous family history of genetic testing for hereditary cancer risks.    She is unaware of any Ashkenazi Jewish ancestry. There is no known consanguinity.    GENETIC TEST RESULTS:  The [facility] Multi-Cancer +RNA Panel found no pathogenic mutations.   The Multi-Cancer + RNA Panel offered by [facility] includes sequencing and/or deletion/duplication analysis of the following 70 genes:  AIP*, ALK, APC*, ATM*, AXIN2*, BAP1*, BARD1*, BLM*, BMPR1A*, BRCA1*, BRCA2*, BRIP1*, CDC73*, CDH1*, CDK4, CDKN1B*, CDKN2A, CHEK2*, CTNNA1*, DICER1*, EPCAM (del/dup only), EGFR, FH*, FLCN*, GREM1 (promoter dup only), HOXB13, KIT, LZTR1, MAX*, MBD4, MEN1*, MET, MITF, MLH1*, MSH2*, MSH3*, MSH6*, MUTYH*, NF1*, NF2*, NTHL1*, PALB2*, PDGFRA, PMS2*, POLD1*, POLE*, POT1*, PRKAR1A*, PTCH1*, PTEN*, RAD51C*, RAD51D*, RB1*, RET, SDHA* (sequencing only), SDHAF2*, SDHB*, SDHC*, SDHD*, SMAD4*, SMARCA4*, SMARCB1*, SMARCE1*, STK11*, SUFU*, TMEM127*, TP53*, TSC1*, TSC2*, VHL*. RNA analysis is performed for * genes. .   The test report has been scanned into EPIC and is located under the Molecular Pathology section of the Results Review tab.  A portion of the result report is included below for reference. Genetic testing reported out on December 08, 2022.     Genetic testing did identify two variants of uncertain significance (VUS) - one in the LZTR1 gene called c.1785G>A (Silent) and a second in the Aurora gene called c.45G>T (p.Arg15Ser). At this time, it is unknown if these variants are associated with increased cancer risk or if they are normal findings, but most variants such as these get reclassified to being inconsequential. They should not be used to make medical management decisions. With time, we suspect the lab will determine the significance of these variants,  if any. If we do learn more about them, we will try to contact Ms. Mcconahy to discuss it further. However, it is important to stay in touch with [place] periodically and keep the address and phone number up to date.   Even though a pathogenic variant was not identified, possible explanations for the cancer in the family may include: There may be no hereditary risk for cancer in the family. The cancers in Ms. Pontecorvo and/or her family may be sporadic/familial or due to other genetic and environmental factors. There may be a gene mutation in one of these genes that current testing methods cannot detect but that chance is small. There could be another gene that has not yet been discovered, or that we have not yet tested, that is responsible for the cancer diagnoses in the family.  It is also possible there is a hereditary cause for the cancer in the family that Ms. Temple did not inherit.  Therefore, it is important to remain in touch with cancer genetics in the future so that we can continue to offer Ms. Orear the most up to date genetic testing.    ADDITIONAL GENETIC TESTING:  We discussed with Ms. Funes that  her genetic testing was fairly extensive.  If there are additional relevant genes identified to increase cancer risk that can be analyzed in the future, we would be happy to discuss and coordinate this testing at that time.     CANCER SCREENING RECOMMENDATIONS:  Ms. Courtney test result is considered negative (normal).  This means that we have not identified a hereditary cause for her personal history of breast cancer at this time.   An individual's cancer risk and medical management are not determined by genetic test results alone. Overall cancer risk assessment incorporates additional factors, including personal medical history, family history, and any available genetic information that may result in a personalized plan for cancer prevention and surveillance. Therefore, it is recommended she  continue to follow the cancer management and screening guidelines provided by her oncology and primary healthcare provider.  RECOMMENDATIONS FOR FAMILY MEMBERS:   Since she did not inherit a identifiable mutation in a cancer predisposition gene included on this panel, her children could not have inherited a known mutation from her in one of these genes. Individuals in this family might be at some increased risk of developing cancer, over the general population risk, due to the family history of cancer.  Individuals in the family should notify their providers of the family history of cancer. We recommend women in this family have a yearly mammogram beginning at age 68, or 29 years younger than the earliest onset of cancer, an annual clinical breast exam, and perform monthly breast self-exams.  We do not recommend familial testing for the NTHL1 or LZTR1 variants of uncertain significance (VUS).  FOLLOW-UP:  Lastly, we discussed with Ms. Lablanc that cancer genetics is a rapidly advancing field and it is possible that new genetic tests will be appropriate for her and/or her family members in the future. We encouraged her to remain in contact with cancer genetics on an annual basis so we can update her personal and family histories and let her know of advances in cancer genetics that may benefit this family.   Our contact number was provided. Ms. Kincaid questions were answered to her satisfaction, and she knows she is welcome to call us at anytime with additional questions or concerns.   Breckan Cafiero M. Joette Catching, [place], [facility] Genetic Counselor [email or web address] (P) [phone]

## 2022-12-19 ENCOUNTER — Other Ambulatory Visit: Payer: Self-pay | Admitting: *Deleted

## 2022-12-19 ENCOUNTER — Encounter: Payer: Self-pay | Admitting: *Deleted

## 2022-12-19 DIAGNOSIS — Z170 Estrogen receptor positive status [ER+]: Secondary | ICD-10-CM

## 2022-12-19 DIAGNOSIS — D0512 Intraductal carcinoma in situ of left breast: Secondary | ICD-10-CM

## 2023-01-12 ENCOUNTER — Encounter: Payer: Self-pay | Admitting: *Deleted

## 2023-01-24 ENCOUNTER — Encounter: Payer: Self-pay | Admitting: *Deleted

## 2023-02-14 ENCOUNTER — Encounter: Payer: Self-pay | Admitting: *Deleted

## 2023-02-15 ENCOUNTER — Telehealth: Payer: Self-pay | Admitting: *Deleted

## 2023-02-15 NOTE — Telephone Encounter (Signed)
Left VM for a return phone call regarding questions patient about her MRI and follow up.

## 2023-02-21 ENCOUNTER — Encounter: Payer: Self-pay | Admitting: *Deleted

## 2023-02-27 ENCOUNTER — Ambulatory Visit: Payer: Self-pay | Admitting: Surgery

## 2023-02-27 NOTE — H&P (Signed)
Subjective   Chief Complaint: New Consultation (Breast Cancer)       History of Present Illness: Norma Robles is a 62 y.o. female who is seen today as an office consultation at the request of Dr. Marcelle Overlie for evaluation of New Consultation (Breast Cancer) .     [name] who is a former patient of Dr. Johna Sheriff.  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  She underwent left lumpectomy and sentinel lymph node biopsy in August 2016.  Margins were negative.  2 lymph nodes were negative.  Oncotype testing was 25.  She underwent radiation and antiestrogen therapy with tamoxifen.   2 weeks ago, she underwent screening mammogram that revealed calcifications in the left breast.  She underwent further workup which revealed 2 adjacent groups of indeterminate calcifications in the lower outer quadrant of the left breast spanning a total of 2.8 cm.  The more anterior group measures 1.0 cm and the smaller posterior group measures 0.5 cm.  She had 2 biopsies of the larger area and one of the small area.  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  Prognostic panel is pending.   The patient was previously treated by Dr. Pamelia Hoit and Basilio Cairo.     Review of Systems: A complete review of systems was obtained from the patient.  I have reviewed this information and discussed as appropriate with the patient.  See HPI as well for other ROS.   Review of Systems  Constitutional: Negative.   HENT: Negative.    Eyes: Negative.   Respiratory: Negative.    Cardiovascular: Negative.   Gastrointestinal: Negative.   Genitourinary: Negative.   Musculoskeletal: Negative.   Skin: Negative.   Neurological: Negative.   Endo/Heme/Allergies: Negative.   Psychiatric/Behavioral: Negative.          Medical History: Past Medical History Past Medical History: Diagnosis Date  History of cancer     Hypertension        Problem List Patient Active Problem List Diagnosis  Malignant neoplasm of overlapping sites of left female breast (CMS-HCC)  Acute sinusitis  Adjustment reaction  Anxiety  Closed fracture of multiple ribs of left side with routine healing  Essential hypertension  Hyperlipemia  Leiomyoma of uterus  Malignant tumor of breast (CMS-HCC)  Obesity  Osteoporosis      Past Surgical History Past Surgical History: Procedure Laterality Date  CHOLECYSTECTOMY      TONSILLECTOMY          Allergies Allergies Allergen Reactions  Codeine Nausea     Other reaction(s): Nausea Only  Other reaction(s): Nausea Only      Medications Ordered Prior to Encounter Current Outpatient Medications on File Prior to Visit Medication Sig Dispense Refill  amLODIPine (NORVASC) 5 MG tablet Take 1 tablet by mouth once daily        No current facility-administered medications on file prior to visit.      Family History Family History Problem Relation Age of Onset  Obesity Mother    High blood pressure (Hypertension) Mother    Diabetes Mother    High blood pressure (Hypertension) Father    Diabetes Sister    Diabetes Brother    Breast cancer Daughter        Tobacco Use History Social History    Tobacco Use Smoking Status Former  Types: Cigarettes  Quit date: 1997  Years since quitting: 27.0 Smokeless Tobacco Never  Social History Social History    Socioeconomic History  Marital status: Single Tobacco Use  Smoking status: Former     Types: Cigarettes     Quit date: 1997     Years since quitting: 27.0  Smokeless tobacco: Never Vaping Use  Vaping Use: Never used Substance and Sexual Activity  Alcohol use: Not Currently  Drug use: Never      Objective:     Vitals:    BP: 132/78   Pulse: 90   Temp: 36.8 C (98.2 F)   SpO2: 97%   Weight: 75.3 kg (166 lb)   Height: 151.8 cm (4' 11.75")   PainSc:   0-No pain   Body mass index is  32.69 kg/m.   Physical Exam    Constitutional:  WDWN in NAD, conversant, no obvious deformities; lying in bed comfortably Eyes:  Pupils equal, round; sclera anicteric; moist conjunctiva; no lid lag HENT:  Oral mucosa moist; good dentition  Neck:  No masses palpated, trachea midline; no thyromegaly Lungs:  CTA bilaterally; normal respiratory effort Breasts:  symmetric, no nipple changes; no palpable masses or lymphadenopathy on either side; well-healed circumareolar incision on the left.  Well-healed axillary incision on the left.  Healing biopsy sites in the lateral left breast CV:  Regular rate and rhythm; no murmurs; extremities well-perfused with no edema Abd:  +bowel sounds, soft, non-tender, no palpable organomegaly; no palpable hernias Musc: Normal gait; no apparent clubbing or cyanosis in extremities Lymphatic:  No palpable cervical or axillary lymphadenopathy Skin:  Warm, dry; no sign of jaundice Psychiatric - alert and oriented x 4; calm mood and affect     Labs, Imaging and Diagnostic Testing: Diagnosis 1. Breast, left, needle core biopsy, lower outer anterior (coil) - DUCTAL CARCINOMA IN SITU, HIGH GRADE - NECROSIS: PRESENT - CALCIFICATIONS: PRESENT - DCIS LENGTH: 0.2 CM 2. Breast, left, needle core biopsy, lower outer posterior (ribbon) - DUCTAL CARCINOMA IN SITU, INTERMEDIATE TO HIGH GRADE - NECROSIS: PRESENT - CALCIFICATIONS: NOT IDENTIFIED - DCIS LENGTH: 0.2 CM 3. Breast, left, needle core biopsy, upper outer (x) - DUCTAL CARCINOMA IN SITU, HIGH GRADE - NECROSIS: PRESENT - CALCIFICATIONS: PRESENT - DCIS LENGTH: 0.2 CM Diagnosis Note 1. , 2 and 3. Dr. Kenyon Ana reviewed the case and concurs with the above diagnosis. A breast prognostic profile (ER and PR) is pending and will be reported in an addendum. [facility] was notified on 11/17/2022. Holley Bouche MD Pathologist, Electronic Signature (Case signed 11/17/2022)   CLINICAL  DATA:  Recall from screening mammography, calcifications involving the LEFT breast. Personal history of malignant LEFT breast lumpectomy in 2016 with adjuvant radiation therapy.   EXAM: DIGITAL DIAGNOSTIC UNILATERAL LEFT MAMMOGRAM WITH CAD   TECHNIQUE: Left digital diagnostic mammography was performed.   COMPARISON:  Previous exam(s).   ACR Breast Density Category b: There are scattered areas of fibroglandular density.   FINDINGS: Spot magnification CC and MLO views of the calcifications and a full field mediolateral view were obtained.   The calcifications in the UPPER OUTER QUADRANT at anterior depth questioned on screening mammography represents coarse linear calcifications. On the screening tomosynthesis images, some of the calcifications had a parallel "tram track" appearance, and are therefore likely arterial.   There are 2 adjacent groups of calcifications involving the LOWER OUTER QUADRANT at anterior depth which are more conspicuous on the magnification views. The larger group spans approximately 1.0 cm and is located anterior and lateral to the smaller group which spans approximately  0.5 cm. In total, the calcifications extend over a 2.8 cm length.   IMPRESSION: Two adjacent groups of indeterminate calcifications in the LOWER OUTER QUADRANT of the LEFT breast which span in total 2.8 cm. The larger more anteriorly located group measures 1.0 cm and the smaller more posteriorly located group measures 0.5 cm.   RECOMMENDATION: Stereotactic tomosynthesis core needle biopsy (x 2) of the 2 adjacent groups of calcifications in the LOWER OUTER QUADRANT.   I have discussed the findings and recommendations with the patient. The stereotactic tomosynthesis core needle biopsy procedure was discussed with the patient and her questions were answered. She wishes to proceed with the biopsy. She will be contacted by the schedulers at the [facility] in  order to schedule the procedure.   BI-RADS CATEGORY  4: Suspicious.     Electronically Signed   By: Hulan Saas M.D.   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  We recommend MRI to determine extent of disease.  She will need to have a left mastectomy.   We will inject Magtrace at the time of surgery in the event that invasive cancer is discovered by Pathology to facilitate a sentinel lymph node biopsy.  She desires lattissimus flap reconstruction, which we will coordinate with Plastic Surgery.  Wilmon Arms. Corliss Skains, MD, [facility]  General Surgery   02/27/2023 5:00 PM

## 2023-03-01 ENCOUNTER — Other Ambulatory Visit: Payer: BC Managed Care – PPO

## 2023-03-06 ENCOUNTER — Encounter: Payer: Self-pay | Admitting: *Deleted

## 2023-03-08 ENCOUNTER — Telehealth: Payer: Self-pay | Admitting: *Deleted

## 2023-03-08 NOTE — Progress Notes (Signed)
HEMATOLOGY-ONCOLOGY TELEPHONE VISIT PROGRESS NOTE  I connected with our patient on 03/12/23 at  9:45 AM EDT by telephone and verified that I am speaking with the correct person using two identifiers.  I discussed the limitations, risks, security and privacy concerns of performing an evaluation and management service by telephone and the availability of in person appointments.  I also discussed with the patient that there may be a patient responsible charge related to this service. The patient expressed understanding and agreed to proceed.   History of Present Illness: Norma Robles is a 62 year old with a history of breast cancer. She presents to the clinic via telephone follow-up.  Oncology History  Breast cancer of upper-outer quadrant of left female breast (HCC)  06/02/2015 Breast US   Left breast: hypoechoic mass at 130, 7 cm from the nipple measuring approximately 1.6 x 0.9 x 1.4 cm. There is a border along the superior margin which appears indistinct.    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 40.05 Gy in 15 fractions. Left Breast Boost. 10 Gy in 5 fractions.  Total dose: 50 Gy   08/30/2015 -  Anti-estrogen oral therapy   Anastrozole 1 mg daily. 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  She had 3 biopsies all of which were ER/PR positive.   12/08/2022 Genetic Testing   Negative genetic testing on the [facility] Multi-Cancer +RNA Panel.  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 Report date is 12/08/2022.   The Multi-Cancer + RNA Panel offered by [facility] includes sequencing and/or deletion/duplication analysis of the following 70 genes:  AIP*, ALK, APC*, ATM*, AXIN2*, BAP1*, BARD1*, BLM*, BMPR1A*, BRCA1*, BRCA2*, BRIP1*, CDC73*, CDH1*, CDK4, CDKN1B*, CDKN2A, CHEK2*, CTNNA1*, DICER1*, EPCAM (del/dup only), EGFR, FH*, FLCN*, GREM1 (promoter dup only), HOXB13, KIT, LZTR1, MAX*, MBD4, MEN1*, MET, MITF, MLH1*, MSH2*, MSH3*, MSH6*, MUTYH*, NF1*, NF2*, NTHL1*, PALB2*, PDGFRA, PMS2*, POLD1*, POLE*, POT1*, PRKAR1A*, PTCH1*, PTEN*, RAD51C*, RAD51D*, RB1*, RET, SDHA* (sequencing only), SDHAF2*, SDHB*, SDHC*, SDHD*, SMAD4*, SMARCA4*, SMARCB1*, SMARCE1*, STK11*, SUFU*, TMEM127*, TP53*, TSC1*, TSC2*, VHL*. RNA analysis is performed for * genes.     REVIEW OF SYSTEMS:   Constitutional: Denies fevers, chills or abnormal weight loss All other systems were reviewed with the patient and are negative. Observations/Objective:     Assessment Plan:  Breast cancer of upper-outer quadrant of left female breast (HCC) Left lumpectomy 06/17/2015: Invasive ductal carcinoma, grade 3, tumor size 1.6 cm, margins negative posterior 1 mm, 0/2 sentinel nodes, ER 100%, PR 60%, HER-2 negative ratio 0.67, Ki-67 30%, T1 cN0 stage IA, Oncotype DX score 25, 16% risk of  recurrence with tamoxifen alone, completed adjuvant radiation 08/27/2015   started anastrozole 09/27/2015.   Current treatment: Adjuvant tamoxifen 20 mg daily 5 years started 09/27/15   Tamoxifen toxicities: 1. Hot flashes: Intermittent, we  previously prescribed Effexor but she never took it. 2. Fatigue and decrease in interest in sex   Severe osteoporosis: T score -3 She is currently on Fosamax 70 mg weekly along with calcium and vitamin D   Recurrence: DCIS 1.  Mammogram 11/07/22: Left Breast Calcs 2. 11/16/22: Biopsies X 3: IG-HG DCIS ER/PR Positive   She plans to undergo mastectomy with latissimus dorsi flap reconstruction with Dr. Leta Baptist (June 6th) (Her daughter Wyatt Mage has Met breast cancer)  Abdominal pain and Back pain: will obtain CT CAP Telephone visit after scan to discuss results   I discussed the assessment and treatment plan with the patient. The patient was provided an opportunity to ask questions and all were answered. The patient agreed with the plan and demonstrated an understanding of the instructions. The patient was advised to call back or seek an in-person evaluation if the symptoms worsen or if the condition fails to improve as anticipated.   I provided 12 minutes of non-face-to-face time during this encounter.  This includes time for charting and coordination of care   Tamsen Meek, MD  I Janan Ridge am acting as a scribe for Dr.Shannyn Jankowiak  I have reviewed the above documentation for accuracy and completeness, and I agree with the above.

## 2023-03-08 NOTE — Telephone Encounter (Signed)
Received call from pt stating her daughter has recently been dx with cancer and underwent a PET scan.  Pt states she would like to disucss with MD regarding a PET or CT scan for preventative measures.  Telephone appt scheduled pt verbalized understanding of appt details.

## 2023-03-12 ENCOUNTER — Inpatient Hospital Stay: Payer: BC Managed Care – PPO | Attending: Genetic Counselor | Admitting: Hematology and Oncology

## 2023-03-12 ENCOUNTER — Telehealth: Payer: Self-pay | Admitting: Hematology and Oncology

## 2023-03-12 DIAGNOSIS — C50412 Malignant neoplasm of upper-outer quadrant of left female breast: Secondary | ICD-10-CM | POA: Diagnosis not present

## 2023-03-12 DIAGNOSIS — Z170 Estrogen receptor positive status [ER+]: Secondary | ICD-10-CM

## 2023-03-12 NOTE — Assessment & Plan Note (Signed)
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   Current treatment: Adjuvant tamoxifen 20 mg daily 5 years started 09/27/15   Tamoxifen toxicities: 1. Hot flashes: Intermittent, we previously prescribed Effexor but she never took it. 2. Fatigue and decrease in interest in sex   Severe osteoporosis: T score -3 She is currently on Fosamax 70 mg weekly along with calcium and vitamin D   Recurrence: DCIS 1.  Mammogram 11/07/22: Left Breast Calcs 2. 11/16/22: Biopsies X 3: IG-HG DCIS ER/PR Positive   She plans to undergo mastectomy with latissimus dorsi flap reconstruction with Dr. Leta Baptist.

## 2023-03-28 ENCOUNTER — Encounter (HOSPITAL_COMMUNITY): Payer: Self-pay

## 2023-03-28 NOTE — Pre-Procedure Instructions (Signed)
Surgical Instructions    Your procedure is scheduled on Thursday, June 6th.  Report to [facility] Main Entrance "A" at 05:30 A.M., then check in with the Admitting office.  Call this number if you have problems the morning of surgery:  [phone]  If you have any questions prior to your surgery date call [phone]: Open Monday-Friday 8am-4pm If you experience any cold or flu symptoms such as cough, fever, chills, shortness of breath, etc. between now and your scheduled surgery, please notify us at the above number.     Remember:  Do not eat after midnight the night before your surgery  You may drink clear liquids until 04:30 AM the morning of your surgery.   Clear liquids allowed are: Water, Non-Citrus Juices (without pulp), Carbonated Beverages, Clear Tea, Black Coffee Only (NO MILK, CREAM OR POWDERED CREAMER of any kind), and Gatorade.  Patient Instructions  The night before surgery:  No food after midnight. ONLY clear liquids after midnight  The day of surgery (if you do NOT have diabetes):  Drink ONE (1) Pre-Surgery Clear Ensure by 04:30 AM the morning of surgery. Drink in one sitting. Do not sip.  This drink was given to you during your hospital  pre-op appointment visit.  Nothing else to drink after completing the  Pre-Surgery Clear Ensure.          If you have questions, please contact your surgeon's office.     Take these medicines the morning of surgery with A SIP OF WATER  amLODipine (NORVASC)  tamoxifen (NOLVADEX)   If needed: ALPRAZolam Prudy Feeler)  Glycerin-Hypromellose-PEG 400 (DRY EYE RELIEF DROPS)     As of today, STOP taking any Aspirin (unless otherwise instructed by your surgeon) Aleve, Naproxen, Ibuprofen, Motrin, Advil, Goody's, BC's, all herbal medications, fish oil, and all vitamins.                     Do NOT Smoke (Tobacco/Vaping) for 24 hours prior to your procedure.  If you use a CPAP at night, you may bring your mask/headgear for your  overnight stay.   Contacts, glasses, piercing's, hearing aid's, dentures or partials may not be worn into surgery, please bring cases for these belongings.    For patients admitted to the hospital, discharge time will be determined by your treatment team.   Patients discharged the day of surgery will not be allowed to drive home, and someone needs to stay with them for 24 hours.  SURGICAL WAITING ROOM VISITATION Patients having surgery or a procedure may have no more than 2 support people in the waiting area - these visitors may rotate.   Children under the age of 59 must have an adult with them who is not the patient. If the patient needs to stay at the hospital during part of their recovery, the visitor guidelines for inpatient rooms apply. Pre-op nurse will coordinate an appropriate time for 1 support person to accompany patient in pre-op.  This support person may not rotate.   Please refer to the [facility] website for the visitor guidelines for Inpatients (after your surgery is over and you are in a regular room).    Special instructions:   [place]- Preparing For Surgery  Before surgery, you can play an important role. Because skin is not sterile, your skin needs to be as free of germs as possible. You can reduce the number of germs on your skin by washing with CHG (chlorahexidine gluconate) Soap before surgery.  CHG  is an antiseptic cleaner which kills germs and bonds with the skin to continue killing germs even after washing.    Oral Hygiene is also important to reduce your risk of infection.  Remember - BRUSH YOUR TEETH THE MORNING OF SURGERY WITH YOUR REGULAR TOOTHPASTE  Please do not use if you have an allergy to CHG or antibacterial soaps. If your skin becomes reddened/irritated stop using the CHG.  Do not shave (including legs and underarms) for at least 48 hours prior to first CHG shower. It is OK to shave your face.  Please follow these instructions carefully.   Shower  the NIGHT BEFORE SURGERY and the MORNING OF SURGERY  If you chose to wash your hair, wash your hair first as usual with your normal shampoo.  After you shampoo, rinse your hair and body thoroughly to remove the shampoo.  Use CHG Soap as you would any other liquid soap. You can apply CHG directly to the skin and wash gently with a scrungie or a clean washcloth.   Apply the CHG Soap to your body ONLY FROM THE NECK DOWN.  Do not use on open wounds or open sores. Avoid contact with your eyes, ears, mouth and genitals (private parts). Wash Face and genitals (private parts)  with your normal soap.   Wash thoroughly, paying special attention to the area where your surgery will be performed.  Thoroughly rinse your body with warm water from the neck down.  DO NOT shower/wash with your normal soap after using and rinsing off the CHG Soap.  Pat yourself dry with a CLEAN TOWEL.  Wear CLEAN PAJAMAS to bed the night before surgery  Place CLEAN SHEETS on your bed the night before your surgery  DO NOT SLEEP WITH PETS.   Day of Surgery: Take a shower with CHG soap. Do not wear jewelry or makeup Do not wear lotions, powders, perfumes, or deodorant. Do not shave 48 hours prior to surgery.   Do not bring valuables to the hospital. [facility] is not responsible for any belongings or valuables. Do not wear nail polish, gel polish, artificial nails, or any other type of covering on natural nails (fingers and toes) If you have artificial nails or gel coating that need to be removed by a nail salon, please have this removed prior to surgery. Artificial nails or gel coating may interfere with anesthesia's ability to adequately monitor your vital signs. Wear Clean/Comfortable clothing the morning of surgery Remember to brush your teeth WITH YOUR REGULAR TOOTHPASTE.   Please read over the following fact sheets that you were given.    If you received a COVID test during your pre-op visit  it is requested  that you wear a mask when out in public, stay away from anyone that may not be feeling well and notify your surgeon if you develop symptoms. If you have been in contact with anyone that has tested positive in the last 10 days please notify you surgeon.

## 2023-03-29 ENCOUNTER — Ambulatory Visit (HOSPITAL_COMMUNITY)
Admission: RE | Admit: 2023-03-29 | Discharge: 2023-03-29 | Disposition: A | Discharge status: Home / Self Care | Payer: BC Managed Care – PPO | Source: Ambulatory Visit | Attending: Hematology and Oncology | Admitting: Hematology and Oncology

## 2023-03-29 ENCOUNTER — Other Ambulatory Visit: Payer: Self-pay

## 2023-03-29 ENCOUNTER — Encounter (HOSPITAL_COMMUNITY): Payer: Self-pay

## 2023-03-29 ENCOUNTER — Encounter (HOSPITAL_COMMUNITY)
Admission: RE | Admit: 2023-03-29 | Discharge: 2023-03-29 | Disposition: A | Discharge status: Home / Self Care | Payer: BC Managed Care – PPO | Source: Ambulatory Visit | Attending: Hematology and Oncology | Admitting: Hematology and Oncology

## 2023-03-29 VITALS — BP 150/79 | HR 103 | Temp 98.2°F | Resp 16 | Ht 60.0 in | Wt 163.7 lb

## 2023-03-29 DIAGNOSIS — Z01818 Encounter for other preprocedural examination: Secondary | ICD-10-CM | POA: Insufficient documentation

## 2023-03-29 DIAGNOSIS — Z170 Estrogen receptor positive status [ER+]: Secondary | ICD-10-CM | POA: Diagnosis present

## 2023-03-29 DIAGNOSIS — I10 Essential (primary) hypertension: Secondary | ICD-10-CM | POA: Insufficient documentation

## 2023-03-29 DIAGNOSIS — C50412 Malignant neoplasm of upper-outer quadrant of left female breast: Secondary | ICD-10-CM | POA: Insufficient documentation

## 2023-03-29 HISTORY — DX: Essential (primary) hypertension: I10

## 2023-03-29 HISTORY — DX: Anxiety disorder, unspecified: F41.9

## 2023-03-29 LAB — CBC
HCT: 42.8 % (ref 36.0–46.0)
Hemoglobin: 14.3 g/dL (ref 12.0–15.0)
MCH: 29.4 pg (ref 26.0–34.0)
MCHC: 33.4 g/dL (ref 30.0–36.0)
MCV: 88.1 fL (ref 80.0–100.0)
Platelets: 315 10*3/uL (ref 150–400)
RBC: 4.86 MIL/uL (ref 3.87–5.11)
RDW: 12.9 % (ref 11.5–15.5)
WBC: 8.7 10*3/uL (ref 4.0–10.5)
nRBC: 0 % (ref 0.0–0.2)

## 2023-03-29 LAB — BASIC METABOLIC PANEL
Anion gap: 8 (ref 5–15)
BUN: 10 mg/dL (ref 8–23)
CO2: 25 mmol/L (ref 22–32)
Calcium: 9.4 mg/dL (ref 8.9–10.3)
Chloride: 105 mmol/L (ref 98–111)
Creatinine, Ser: 0.73 mg/dL (ref 0.44–1.00)
GFR, Estimated: >60 mL/min (ref >60)
Glucose, Bld: 97 mg/dL (ref 70–99)
Potassium: 3.8 mmol/L (ref 3.5–5.1)
Sodium: 138 mmol/L (ref 135–145)

## 2023-03-29 LAB — POCT I-STAT CREATININE: Creatinine, Ser: 0.6 mg/dL (ref 0.44–1.00)

## 2023-03-29 MED ORDER — IOHEXOL 300 MG/ML  SOLN
100.0000 mL | Freq: Once | INTRAMUSCULAR | Status: AC | PRN
  Administered 2023-03-29: 100 mL via INTRAVENOUS

## 2023-03-29 NOTE — H&P (Signed)
ubjective:      HPI   Returns for follow up discussion breast reconstruction, last seen Jan 2024. Presented following screening MMG with left breast calcifications. Diagnostic MMG showed two adjacent groups of calcifications in the left LOQ spanning in total 2.8 cm. Biopsies labeled left lower outer anterior, left upper outer and left lower outer posterior all showed high grade DCIS with calcifications, ER/PR+.     MRI 11/2022 demonstrated left UOQ enhancement 2.5 x 1.7 cm axially, corresponding to the X shaped biopsy clip. Enhancement noted in the lower inner and central breast and lower outer breast spans 5.8 cm. There is additional abnormal enhancement in the medial retroareolar breast as well as anterior left breast skin thickening with bright T2 signal consistent with edema. No abnormal appearing lymph nodes.   Diagnosed 2016 with left breast cancer. Underwent lumpectomy SLN with Dr. Johna Sheriff with final pathology 1.6 cm IDC ER/PR+ Her2-, margins clear, 0/2 SLN. Oncotype 25. Completed adjuvant radiation. Could not tolerate anastrozole. Per chart also severe osteoporosis noted. Complete a year of tamoxifen treatment.      Given prior RT, mastectomy recommended. Since last visit patient has been on anastrozole. Goal of this was to allow for lumpectomy if possible. Patient has now decided to proceed with mastectomy. Patient declined f/u MRI.  tamoxifen with VUS in LZTR1 and NTHL1 . Daughter diagnosed with breast ca age 23; her genetics were negative. Patient reports daughter found out she has metastatic disease to lungs yesterday.   Current 40 D. Desires smaller. Reports no asymmetry post lumpectomy, RT.    Works as Conservator, museum/gallery for [employer]. Can work from home if needed. Lives alone. Sister and 2 daughters in area to assist with post op care.   Review of Systems   Remainder 12 point review negative   Objective:  Physical Exam Cardiovascular:     Rate and Rhythm: Normal rate.   Pulmonary:     Effort: Pulmonary effort is normal.  Abdominal:     Comments: Port scars, no hernias, sufficient soft tissue for reconstruction  Lymphadenopathy:     Upper Body:     Right upper body: No axillary adenopathy.     Left upper body: No axillary adenopathy.  Neurological:     Mental Status: She is oriented to person, place, and time.      Breasts: Curvilinear incision  Left UOQ no palpable masses Sn to nipple R 27 L 27 cm BW R 22 L 22 cm CW 15 cm Nipple to IMF R 10 L 10 cm     Assessment:    DCIS left breast History left breast ca S/p lumpectomy, adjuvant RT    Plan:    Hold tamoxifen week prior to surgery.   Plan left skin reduction mastectomy, tissue expander reconstruction, left latissimus dorsi flap to left chest.    Reviewed options prosthesis. Reviewed immediate vs delayed reconstruction. Discussed autologous vs implant based reconstruction. Reviewed incisions, drains, OR length, hospital stay and recovery for each. Discussed process of expansion and implant based risks including rupture, imaging surveillance for silicone implants, infection requiring surgery or removal, contracture. Reviewed asymmetry one can expect with unilateral implant reconstruction. Reviewed timing future surgery, that multiple surgeries common with either purely autologous and implant based reconstruction.    Reviewed reconstruction will be asensate and not stimulate. Reviewed risks mastectomy flap necrosis requiring additional surgery. Given her ptosis and breast size recommend skin reduction pattern. Reviewed anchor type scar.    Given her history radiation, reviewed  this will significantly increase her risks of reconstruction including wound healing problems and contracture. Autologous tissue would reduce these risks back to baseline. Discussed in setting implant based reconstruction, recommend latissimus flap with expander. Discussed donor site incision, function latissimus, drains.  Alternative would be consultation microsurgeon. Counseled purely autologous reconstruction would likely giver her the most symmetry with native breast out of bra. Counseled even with LD flap, implant based reconsrtuction will be higher on chest and rounder appearance than natural breast.    Additional risks including but not limited to bleeding hematoma seroma damage to adjacent structures need for additional procedure blood clots in legs or lungs wound healing problems failure flap reviewed.   Drain teaching completed. Rx for oxycodone doxycycline and robaxin given.    Glenna Fellows, MD [facility]  Office/ physician access line after hours [phone]

## 2023-03-29 NOTE — Progress Notes (Signed)
PCP - Dr. Bernadette Hoit Cardiologist - denies  PPM/ICD - denies   Chest x-ray - 03/12/16 EKG - 02/15/22 Stress Test - denies ECHO - denies Cardiac Cath - denies  Sleep Study - denies   DM- denies  ASA/Blood Thinner Instructions: n/a   ERAS Protcol - yes PRE-SURGERY Ensure given at PAT  COVID TEST- n/a   Anesthesia review: no  Patient denies shortness of breath, fever, cough and chest pain at PAT appointment   All instructions explained to the patient, with a verbal understanding of the material. Patient agrees to go over the instructions while at home for a better understanding.  The opportunity to ask questions was provided.

## 2023-03-30 ENCOUNTER — Other Ambulatory Visit: Payer: Self-pay

## 2023-03-30 ENCOUNTER — Telehealth: Payer: Self-pay

## 2023-03-30 DIAGNOSIS — N2889 Other specified disorders of kidney and ureter: Secondary | ICD-10-CM

## 2023-03-30 NOTE — Telephone Encounter (Signed)
Received call from radiology reading room regarding CT CAP results below:  "No evidence of metastatic disease within the chest, abdomen, or pelvis.   2.2 cm hyperenhancing mass in midpole of right kidney, highly suspicious for renal cell carcinoma. Recommend abdomen MRI without and with contrast for further characterization."  MRI abd ordered and scheduled for 04/01/23. Called Pt and notified that we are ordering MR based on CT recommendation. Scheduled phone visit with MD for 04/02/23 to discuss results. Pt verbalized understanding.

## 2023-04-01 ENCOUNTER — Ambulatory Visit (HOSPITAL_COMMUNITY): Payer: BC Managed Care – PPO

## 2023-04-02 ENCOUNTER — Inpatient Hospital Stay: Payer: BC Managed Care – PPO | Attending: Genetic Counselor | Admitting: Hematology and Oncology

## 2023-04-02 DIAGNOSIS — Z79810 Long term (current) use of selective estrogen receptor modulators (SERMs): Secondary | ICD-10-CM | POA: Insufficient documentation

## 2023-04-02 DIAGNOSIS — Z170 Estrogen receptor positive status [ER+]: Secondary | ICD-10-CM | POA: Insufficient documentation

## 2023-04-02 DIAGNOSIS — Z923 Personal history of irradiation: Secondary | ICD-10-CM | POA: Insufficient documentation

## 2023-04-02 DIAGNOSIS — C50412 Malignant neoplasm of upper-outer quadrant of left female breast: Secondary | ICD-10-CM | POA: Diagnosis not present

## 2023-04-02 DIAGNOSIS — Z79899 Other long term (current) drug therapy: Secondary | ICD-10-CM | POA: Insufficient documentation

## 2023-04-02 DIAGNOSIS — N2889 Other specified disorders of kidney and ureter: Secondary | ICD-10-CM

## 2023-04-02 DIAGNOSIS — M810 Age-related osteoporosis without current pathological fracture: Secondary | ICD-10-CM | POA: Insufficient documentation

## 2023-04-02 NOTE — Progress Notes (Signed)
HEMATOLOGY-ONCOLOGY TELEPHONE VISIT PROGRESS NOTE  I connected with our patient on 04/02/23 at  2:15 PM EDT by telephone and verified that I am speaking with the correct person using two identifiers.  I discussed the limitations, risks, security and privacy concerns of performing an evaluation and management service by telephone and the availability of in person appointments.  I also discussed with the patient that there may be a patient responsible charge related to this service. The patient expressed understanding and agreed to proceed.   History of Present Illness: Follow-up to discuss results of the CT scans.  She has surgery appointment coming up on Thursday.  Oncology History  Breast cancer of upper-outer quadrant of left female breast (HCC)  06/02/2015 Breast US   Left breast: hypoechoic mass at 130, 7 cm from the nipple measuring approximately 1.6 x 0.9 x 1.4 cm. There is a border along the superior margin which appears indistinct.    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 40.05 Gy in 15 fractions. Left Breast Boost. 10 Gy in 5 fractions.  Total dose: 50 Gy   08/30/2015 -  Anti-estrogen oral therapy   Anastrozole 1 mg daily. Switch to tamoxifen 10 mg daily July 2017 due to severe osteoporosis T score -3   09/27/2015 Survivorship   Survivorship care plan completed and mailed to  patient in lieu of in person visit   11/16/2022 Relapse/Recurrence   Mammogram detected left breast calcifications which on biopsy came back as intermediate to high-grade DCIS.  She had 3 biopsies all of which were ER/PR positive.   12/08/2022 Genetic Testing   Negative genetic testing on the [facility] Multi-Cancer +RNA Panel.  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 Report date is 12/08/2022.   The Multi-Cancer + RNA Panel offered by [facility] includes sequencing and/or deletion/duplication analysis of the following 70 genes:  AIP*, ALK, APC*, ATM*, AXIN2*, BAP1*, BARD1*, BLM*, BMPR1A*, BRCA1*, BRCA2*, BRIP1*, CDC73*, CDH1*, CDK4, CDKN1B*, CDKN2A, CHEK2*, CTNNA1*, DICER1*, EPCAM (del/dup only), EGFR, FH*, FLCN*, GREM1 (promoter dup only), HOXB13, KIT, LZTR1, MAX*, MBD4, MEN1*, MET, MITF, MLH1*, MSH2*, MSH3*, MSH6*, MUTYH*, NF1*, NF2*, NTHL1*, PALB2*, PDGFRA, PMS2*, POLD1*, POLE*, POT1*, PRKAR1A*, PTCH1*, PTEN*, RAD51C*, RAD51D*, RB1*, RET, SDHA* (sequencing only), SDHAF2*, SDHB*, SDHC*, SDHD*, SMAD4*, SMARCA4*, SMARCB1*, SMARCE1*, STK11*, SUFU*, TMEM127*, TP53*, TSC1*, TSC2*, VHL*. RNA analysis is performed for * genes.     REVIEW OF SYSTEMS:   Constitutional: Denies fevers, chills or abnormal weight loss All other systems were reviewed with the patient and are negative. 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   Current treatment: Adjuvant tamoxifen 20 mg daily 5 years started 09/27/15   Tamoxifen toxicities: 1. Hot flashes: Intermittent, we previously  prescribed Effexor but she never took it. 2. Fatigue and decrease in interest in sex   Severe osteoporosis: T score -3 She is currently on Fosamax 70 mg weekly along with calcium and vitamin D   Recurrence: DCIS 1.  Mammogram 11/07/22: Left Breast Calcs 2. 11/16/22: Biopsies X 3: IG-HG DCIS ER/PR Positive   She plans to undergo mastectomy with latissimus dorsi flap reconstruction with Dr. Leta Baptist (June 6th) (Her daughter Wyatt Mage has Met breast cancer)  Abdominal pain and Back pain:  CT CAP 03/30/2023: No evidence of metastatic disease.  2.2 cm mass right kidney suspicious for renal cell cancer, 2.6 cm left ovarian cyst, 2.7 cm right thyroid nodule  Treatment plan: Refer the patient to urology (MRI of the kidney is being set up for 04/03/2023) Previous thyroid ultrasounds and biopsies were negative.  She has a follow-up with me to discuss results.   I discussed the assessment and treatment plan with the patient. The patient was provided an opportunity to ask questions and all were answered. The patient agreed with the plan and demonstrated an understanding of the instructions. The patient was advised to call back or seek an in-person evaluation if the symptoms worsen or if the condition fails to improve as anticipated.   I provided 12 minutes of non-face-to-face time during this encounter.  This includes time for charting and coordination of care   Tamsen Meek, MD

## 2023-04-02 NOTE — Assessment & Plan Note (Signed)
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   Current treatment: Adjuvant tamoxifen 20 mg daily 5 years started 09/27/15   Tamoxifen toxicities: 1. Hot flashes: Intermittent, we previously prescribed Effexor but she never took it. 2. Fatigue and decrease in interest in sex   Severe osteoporosis: T score -3 She is currently on Fosamax 70 mg weekly along with calcium and vitamin D   Recurrence: DCIS 1.  Mammogram 11/07/22: Left Breast Calcs 2. 11/16/22: Biopsies X 3: IG-HG DCIS ER/PR Positive   She plans to undergo mastectomy with latissimus dorsi flap reconstruction with Dr. Leta Baptist (June 6th) (Her daughter Wyatt Mage has Met breast cancer)  Abdominal pain and Back pain:  CT CAP 03/30/2023: No evidence of metastatic disease.  2.2 cm mass right kidney suspicious for renal cell cancer, 2.6 cm left ovarian cyst, 2.7 cm right thyroid nodule  Treatment plan: Will obtain abdominal MRI and refer the patient to urology Will obtain thyroid ultrasound  Return to clinic in 1 year for follow-up

## 2023-04-03 ENCOUNTER — Ambulatory Visit (HOSPITAL_COMMUNITY)
Admission: RE | Admit: 2023-04-03 | Discharge: 2023-04-03 | Disposition: A | Discharge status: Home / Self Care | Payer: BC Managed Care – PPO | Source: Ambulatory Visit | Attending: Hematology and Oncology | Admitting: Hematology and Oncology

## 2023-04-03 ENCOUNTER — Encounter: Payer: Self-pay | Admitting: *Deleted

## 2023-04-03 DIAGNOSIS — N2889 Other specified disorders of kidney and ureter: Secondary | ICD-10-CM | POA: Insufficient documentation

## 2023-04-03 MED ORDER — GADOBUTROL 1 MMOL/ML IV SOLN
7.0000 mL | Freq: Once | INTRAVENOUS | Status: AC | PRN
  Administered 2023-04-03: 7 mL via INTRAVENOUS

## 2023-04-03 NOTE — Progress Notes (Signed)
Per MD request RN successfully faxed STAT referral to [facility] [phone]).

## 2023-04-04 NOTE — Anesthesia Preprocedure Evaluation (Signed)
Anesthesia Evaluation  Patient identified by MRN, date of birth, ID band Patient awake    Reviewed: Allergy & Precautions, NPO status , Patient's Chart, lab work & pertinent test results  History of Anesthesia Complications Negative for: history of anesthetic complications  Airway Mallampati: III  TM Distance: >3 FB Neck ROM: Full    Dental  (+) Dental Advisory Given   Pulmonary neg shortness of breath, neg sleep apnea, neg COPD, neg recent URI, former smoker   Pulmonary exam normal breath sounds clear to auscultation       Cardiovascular hypertension (amlodipine, HCTZ), Pt. on medications (-) angina (-) Past MI, (-) Cardiac Stents and (-) CABG (-) dysrhythmias  Rhythm:Regular Rate:Normal     Neuro/Psych neg Seizures PSYCHIATRIC DISORDERS Anxiety        GI/Hepatic negative GI ROS, Neg liver ROS,,,  Endo/Other  negative endocrine ROS    Renal/GU negative Renal ROS     Musculoskeletal   Abdominal  (+) + obese  Peds  Hematology negative hematology ROS (+)   Anesthesia Other Findings Left breast cancer  Reproductive/Obstetrics                             Anesthesia Physical Anesthesia Plan  ASA: 3  Anesthesia Plan: General   Post-op Pain Management: Regional block* and Tylenol PO (pre-op)*   Induction: Intravenous  PONV Risk Score and Plan: 3 and Ondansetron and Dexamethasone  Airway Management Planned:   Additional Equipment:   Intra-op Plan:   Post-operative Plan: Extubation in OR  Informed Consent: I have reviewed the patients History and Physical, chart, labs and discussed the procedure including the risks, benefits and alternatives for the proposed anesthesia with the patient or authorized representative who has indicated his/her understanding and acceptance.     Dental advisory given  Plan Discussed with: CRNA and Anesthesiologist  Anesthesia Plan Comments: (Discussed  potential risks of nerve blocks including, but not limited to, infection, bleeding, nerve damage, seizures, pneumothorax, respiratory depression, and potential failure of the block. Alternatives to nerve blocks discussed. All questions answered.  Risks of general anesthesia discussed including, but not limited to, sore throat, hoarse voice, chipped/damaged teeth, injury to vocal cords, nausea and vomiting, allergic reactions, lung infection, heart attack, stroke, and death. All questions answered. )        Anesthesia Quick Evaluation

## 2023-04-04 NOTE — H&P (Signed)
Subjective   Chief Complaint: New Consultation (Breast Cancer)       History of Present Illness: Norma Robles is a 62 y.o. female who is seen today as an office consultation at the request of Dr. Marcelle Overlie for evaluation of New Consultation (Breast Cancer) .     [name] who is a former patient of Dr. Johna Sheriff.  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  She underwent left lumpectomy and sentinel lymph node biopsy in August 2016.  Margins were negative.  2 lymph nodes were negative.  Oncotype testing was 25.  She underwent radiation and antiestrogen therapy with tamoxifen.   2 weeks ago, she underwent screening mammogram that revealed calcifications in the left breast.  She underwent further workup which revealed 2 adjacent groups of indeterminate calcifications in the lower outer quadrant of the left breast spanning a total of 2.8 cm.  The more anterior group measures 1.0 cm and the smaller posterior group measures 0.5 cm.  She had 2 biopsies of the larger area and one of the small area.  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  Prognostic panel is pending.   The patient was previously treated by Dr. Pamelia Hoit and Basilio Cairo.     Review of Systems: A complete review of systems was obtained from the patient.  I have reviewed this information and discussed as appropriate with the patient.  See HPI as well for other ROS.   Review of Systems  Constitutional: Negative.   HENT: Negative.    Eyes: Negative.   Respiratory: Negative.    Cardiovascular: Negative.   Gastrointestinal: Negative.   Genitourinary: Negative.   Musculoskeletal: Negative.   Skin: Negative.   Neurological: Negative.   Endo/Heme/Allergies: Negative.   Psychiatric/Behavioral: Negative.          Medical History: Past Medical History Past Medical History: Diagnosis        Date            History of  cancer                      Hypertension           Problem List Patient Active Problem List Diagnosis            Malignant neoplasm of overlapping sites of left female breast (CMS-HCC)            Acute sinusitis            Adjustment reaction            Anxiety            Closed fracture of multiple ribs of left side with routine healing            Essential hypertension            Hyperlipemia            Leiomyoma of uterus            Malignant tumor of breast (CMS-HCC)            Obesity            Osteoporosis       Past Surgical History Past Surgical History: Procedure       Laterality         Date            CHOLECYSTECTOMY  TONSILLECTOMY                         Allergies Allergies Allergen           Reactions            Codeine           Nausea                         Other reaction(s): Nausea Only  Other reaction(s): Nausea Only       Medications Ordered Prior to Encounter Current Outpatient Medications on File Prior to Visit Medication       Sig       Dispense         Refill            amLODIPine (NORVASC) 5 MG tablet          Take 1 tablet by mouth once daily                        No current facility-administered medications on file prior to visit.       Family History Family History Problem           Relation           Age of Onset            Obesity            Mother              High blood pressure (Hypertension)   Mother              Diabetes          Mother              High blood pressure (Hypertension)   Father               Diabetes          Sister                Diabetes          Brother                         Breast cancer  Daughter                 Tobacco Use History Social History     Tobacco Use Smoking Status           Former            Types: Cigarettes            Quit date:        1997            Years since quitting:   27.0 Smokeless Tobacco   Never       Social History Social  History     Socioeconomic History            Marital status:  Single Tobacco Use            Smoking status:          Former                         Types: Cigarettes  Quit date:        61                         Years since quitting:   27.0            Smokeless tobacco:    Never Vaping Use            Vaping Use:    Never used Substance and Sexual Activity            Alcohol use:    Not Currently            Drug use:        Never       Objective:     Vitals:              BP:      132/78   Pulse:  90          Temp:  36.8 C (98.2 F)           SpO2:  97%       Weight:            75.3 kg (166 lb)             Height: 151.8 cm (4' 11.75")      PainSc:                        0-No pain   Body mass index is 32.69 kg/m.   Physical Exam    Constitutional:  WDWN in NAD, conversant, no obvious deformities; lying in bed comfortably Eyes:  Pupils equal, round; sclera anicteric; moist conjunctiva; no lid lag HENT:  Oral mucosa moist; good dentition  Neck:  No masses palpated, trachea midline; no thyromegaly Lungs:  CTA bilaterally; normal respiratory effort Breasts:  symmetric, no nipple changes; no palpable masses or lymphadenopathy on either side; well-healed circumareolar incision on the left.  Well-healed axillary incision on the left.  Healing biopsy sites in the lateral left breast CV:  Regular rate and rhythm; no murmurs; extremities well-perfused with no edema Abd:  +bowel sounds, soft, non-tender, no palpable organomegaly; no palpable hernias Musc: Normal gait; no apparent clubbing or cyanosis in extremities Lymphatic:  No palpable cervical or axillary lymphadenopathy Skin:  Warm, dry; no sign of jaundice Psychiatric - alert and oriented x 4; calm mood and affect     Labs, Imaging and Diagnostic Testing: Diagnosis 1. Breast, left, needle core biopsy, lower outer anterior (coil) - DUCTAL CARCINOMA IN SITU, HIGH GRADE - NECROSIS: PRESENT -  CALCIFICATIONS: PRESENT - DCIS LENGTH: 0.2 CM 2. Breast, left, needle core biopsy, lower outer posterior (ribbon) - DUCTAL CARCINOMA IN SITU, INTERMEDIATE TO HIGH GRADE - NECROSIS: PRESENT - CALCIFICATIONS: NOT IDENTIFIED - DCIS LENGTH: 0.2 CM 3. Breast, left, needle core biopsy, upper outer (x) - DUCTAL CARCINOMA IN SITU, HIGH GRADE - NECROSIS: PRESENT - CALCIFICATIONS: PRESENT - DCIS LENGTH: 0.2 CM Diagnosis Note 1. , 2 and 3. Dr. Kenyon Ana reviewed the case and concurs with the above diagnosis. A breast prognostic profile (ER and PR) is pending and will be reported in an addendum. [facility] was notified on 11/17/2022. Holley Bouche MD Pathologist, Electronic Signature (Case signed 11/17/2022)   CLINICAL DATA:  Recall from screening mammography, calcifications involving the LEFT breast. Personal history of malignant LEFT breast lumpectomy in 2016 with adjuvant radiation therapy.   EXAM: DIGITAL DIAGNOSTIC UNILATERAL  LEFT MAMMOGRAM WITH CAD   TECHNIQUE: Left digital diagnostic mammography was performed.   COMPARISON:  Previous exam(s).   ACR Breast Density Category b: There are scattered areas of fibroglandular density.   FINDINGS: Spot magnification CC and MLO views of the calcifications and a full field mediolateral view were obtained.   The calcifications in the UPPER OUTER QUADRANT at anterior depth questioned on screening mammography represents coarse linear calcifications. On the screening tomosynthesis images, some of the calcifications had a parallel "tram track" appearance, and are therefore likely arterial.   There are 2 adjacent groups of calcifications involving the LOWER OUTER QUADRANT at anterior depth which are more conspicuous on the magnification views. The larger group spans approximately 1.0 cm and is located anterior and lateral to the smaller group which spans approximately 0.5 cm. In total, the calcifications extend  over a 2.8 cm length.   IMPRESSION: Two adjacent groups of indeterminate calcifications in the LOWER OUTER QUADRANT of the LEFT breast which span in total 2.8 cm. The larger more anteriorly located group measures 1.0 cm and the smaller more posteriorly located group measures 0.5 cm.   RECOMMENDATION: Stereotactic tomosynthesis core needle biopsy (x 2) of the 2 adjacent groups of calcifications in the LOWER OUTER QUADRANT.   I have discussed the findings and recommendations with the patient. The stereotactic tomosynthesis core needle biopsy procedure was discussed with the patient and her questions were answered. She wishes to proceed with the biopsy. She will be contacted by the schedulers at the [facility] in order to schedule the procedure.   BI-RADS CATEGORY  4: Suspicious.     Electronically Signed   By: Hulan Saas M.D.   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  We recommend MRI to determine extent of disease.  She will need to have a left mastectomy.    We will inject Magtrace at the time of surgery in the event that invasive cancer is discovered by Pathology to facilitate a sentinel lymph node biopsy     Wilmon Arms. Corliss Skains, MD, [facility]  General Surgery   04/04/2023 10:47 AM

## 2023-04-04 NOTE — Progress Notes (Signed)
Patient Care Team: Angelica Chessman, MD as PCP - General (Family Medicine) Richarda Overlie, MD as Consulting Physician (Obstetrics and Gynecology) Serena Croissant, MD as Consulting Physician (Hematology and Oncology) Lonie Peak, MD as Attending Physician (Radiation Oncology) Pershing Proud, RN as Oncology Nurse Navigator Donnelly Angelica, RN as Oncology Nurse Navigator  DIAGNOSIS:  Encounter Diagnosis  Name Primary?   Malignant neoplasm of upper-outer quadrant of left breast in female, estrogen receptor positive (HCC) Yes    SUMMARY OF ONCOLOGIC HISTORY: Oncology History  Breast cancer of upper-outer quadrant of left female breast (HCC)  06/02/2015 Breast US   Left breast: hypoechoic mass at 130, 7 cm from the nipple measuring approximately 1.6 x 0.9 x 1.4 cm. There is a border along the superior margin which appears indistinct.    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 40.05 Gy in 15 fractions. Left Breast Boost. 10 Gy in 5 fractions.  Total dose: 50 Gy   08/30/2015 -  Anti-estrogen oral therapy   Anastrozole 1 mg daily. Switch to tamoxifen 10 mg daily July 2017 due to severe osteoporosis T score -3   09/27/2015 Survivorship   Survivorship care plan completed and mailed to patient in lieu of in person visit   11/16/2022 Relapse/Recurrence   Mammogram detected left  breast calcifications which on biopsy came back as intermediate to high-grade DCIS.  She had 3 biopsies all of which were ER/PR positive.   12/08/2022 Genetic Testing   Negative genetic testing on the [facility] Multi-Cancer +RNA Panel.  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 Report date is 12/08/2022.   The Multi-Cancer + RNA Panel offered by [facility] includes sequencing and/or deletion/duplication analysis of the following 70 genes:  AIP*, ALK, APC*, ATM*, AXIN2*, BAP1*, BARD1*, BLM*, BMPR1A*, BRCA1*, BRCA2*, BRIP1*, CDC73*, CDH1*, CDK4, CDKN1B*, CDKN2A, CHEK2*, CTNNA1*, DICER1*, EPCAM (del/dup only), EGFR, FH*, FLCN*, GREM1 (promoter dup only), HOXB13, KIT, LZTR1, MAX*, MBD4, MEN1*, MET, MITF, MLH1*, MSH2*, MSH3*, MSH6*, MUTYH*, NF1*, NF2*, NTHL1*, PALB2*, PDGFRA, PMS2*, POLD1*, POLE*, POT1*, PRKAR1A*, PTCH1*, PTEN*, RAD51C*, RAD51D*, RB1*, RET, SDHA* (sequencing only), SDHAF2*, SDHB*, SDHC*, SDHD*, SMAD4*, SMARCA4*, SMARCB1*, SMARCE1*, STK11*, SUFU*, TMEM127*, TP53*, TSC1*, TSC2*, VHL*. RNA analysis is performed for * genes.     CHIEF COMPLIANT: Follow up mastectomy  INTERVAL HISTORY: Norma Robles is a 62 year old with a history of breast cancer. She presents to the clinic for a follow-up. She reports that everything went well. She states that she has some hot flashes from taking the tamoxifen. Overall she is tolerating it fairly well. She denies any pain or issues.   ALLERGIES:  is allergic to codeine.  MEDICATIONS:  Current Outpatient Medications  Medication Sig Dispense Refill   ALPRAZolam (XANAX) 0.5 MG tablet Take 0.5 mg by mouth daily as needed for anxiety.     amLODipine (NORVASC) 5 MG tablet Take 5 mg by  mouth daily.     Cholecalciferol (VITAMIN D) 2000 units CAPS Take 1 capsule (2,000 Units total) by mouth daily. 30 capsule    Glycerin-Hypromellose-PEG 400 (DRY EYE RELIEF DROPS) 0.2-0.2-1 % SOLN Place 1 drop into both eyes daily as needed (Dry eye).      hydrochlorothiazide (MICROZIDE) 12.5 MG capsule Take 12.5 mg by mouth daily as needed (Swelling).  0   Pseudoephedrine-Ibuprofen (ADVIL COLD/SINUS) 30-200 MG TABS Take 1 tablet by mouth daily as needed (Allergies).     sennosides-docusate sodium (SENOKOT-S) 8.6-50 MG tablet Take 2 tablets by mouth 2 (two) times daily as needed for constipation.     tamoxifen (NOLVADEX) 20 MG tablet Take 1 tablet (20 mg total) by mouth daily. 90 tablet 3   No current facility-administered medications for this visit.    PHYSICAL EXAMINATION: ECOG PERFORMANCE STATUS: 1 - Symptomatic but completely ambulatory  Vitals:   04/17/23 0912  BP: 138/85  Pulse: (!) 101  Resp: 18  Temp: 97.8 F (36.6 C)  SpO2: 98%   Filed Weights   04/17/23 0912  Weight: 163 lb 8 oz (74.2 kg)      LABORATORY DATA:  I have reviewed the data as listed    Latest Ref Rng & Units 03/29/2023    2:56 PM 03/29/2023    9:45 AM 03/12/2016    8:49 PM  CMP  Glucose 70 - 99 mg/dL 97   811   BUN 8 - 23 mg/dL 10   12   Creatinine 9.14 - 1.00 mg/dL 7.82  9.56  2.13   Sodium 135 - 145 mmol/L 138   140   Potassium 3.5 - 5.1 mmol/L 3.8   3.9   Chloride 98 - 111 mmol/L 105   106   CO2 22 - 32 mmol/L 25   26   Calcium 8.9 - 10.3 mg/dL 9.4   9.8     Lab Results  Component Value Date   WBC 8.7 03/29/2023   HGB 14.3 03/29/2023   HCT 42.8 03/29/2023   MCV 88.1 03/29/2023   PLT 315 03/29/2023   NEUTROABS 5.1 06/09/2015    ASSESSMENT & PLAN:  Breast cancer of upper-outer quadrant of left female breast (HCC) Left lumpectomy 06/17/2015: Invasive ductal carcinoma, grade 3, tumor size 1.6 cm, margins negative posterior 1 mm, 0/2 sentinel nodes, ER 100%, PR 60%, HER-2 negative ratio 0.67, Ki-67 30%, T1 cN0 stage IA, Oncotype DX score 25, 16% risk of recurrence with tamoxifen alone, completed adjuvant radiation 08/27/2015   started anastrozole 09/27/2015 switched to tamoxifen 09/19/2015 until 2021   Current treatment: Restarted adjuvant  tamoxifen 20 mg daily started January 2024   Tamoxifen toxicities: 1. Hot flashes: Intermittent. 2. Fatigue   Severe osteoporosis: T score -3 She is currently on Fosamax 70 mg weekly along with calcium and vitamin D   Recurrence: DCIS 1.  Mammogram 11/07/22: Left Breast Calcs 2. 11/16/22: Biopsies X 3: IG-HG DCIS ER/PR Positive 3.  04/05/2023: Left mastectomy: Intermediate to high-grade DCIS 2.5 cm, margins negative, ER 95%, PR 95%  04/03/2023: MRI abdomen: 2.4 cm enhancing exophytic mass right mid to lower kidney: Differential intracerebral adipose tissue versus atypical/lipid poor angiomyolipoma versus renal cell carcinoma: Urology referral made.  She is going to see urology on 04/30/2023.  Continue with tamoxifen and follow-up with me in 6 months She is planning on delayed reconstruction.   No orders of the defined types were placed in this encounter.  The patient has a good understanding of the overall  plan. she agrees with it. she will call with any problems that may develop before the next visit here. Total time spent: 30 mins including face to face time and time spent for planning, charting and co-ordination of care   Tamsen Meek, MD 04/17/23    I Janan Ridge am acting as a Neurosurgeon for [employer]  I have reviewed the above documentation for accuracy and completeness, and I agree with the above.

## 2023-04-05 ENCOUNTER — Other Ambulatory Visit: Payer: Self-pay

## 2023-04-05 ENCOUNTER — Inpatient Hospital Stay (HOSPITAL_COMMUNITY): Payer: BC Managed Care – PPO | Admitting: Anesthesiology

## 2023-04-05 ENCOUNTER — Encounter (HOSPITAL_COMMUNITY): Payer: Self-pay | Admitting: Surgery

## 2023-04-05 ENCOUNTER — Inpatient Hospital Stay (HOSPITAL_COMMUNITY)
Admission: RE | Admit: 2023-04-05 | Discharge: 2023-04-06 | DRG: 583 | Disposition: A | Discharge status: Home / Self Care | Payer: BC Managed Care – PPO | Attending: Surgery | Admitting: Surgery

## 2023-04-05 ENCOUNTER — Encounter (HOSPITAL_COMMUNITY)
Admission: RE | Disposition: A | Discharge status: Home / Self Care | Payer: Self-pay | Source: Home / Self Care | Attending: Surgery

## 2023-04-05 DIAGNOSIS — Z833 Family history of diabetes mellitus: Secondary | ICD-10-CM | POA: Diagnosis not present

## 2023-04-05 DIAGNOSIS — Z6832 Body mass index (BMI) 32.0-32.9, adult: Secondary | ICD-10-CM | POA: Diagnosis not present

## 2023-04-05 DIAGNOSIS — I10 Essential (primary) hypertension: Secondary | ICD-10-CM | POA: Diagnosis present

## 2023-04-05 DIAGNOSIS — M810 Age-related osteoporosis without current pathological fracture: Secondary | ICD-10-CM | POA: Diagnosis present

## 2023-04-05 DIAGNOSIS — E785 Hyperlipidemia, unspecified: Secondary | ICD-10-CM | POA: Diagnosis present

## 2023-04-05 DIAGNOSIS — N2889 Other specified disorders of kidney and ureter: Secondary | ICD-10-CM | POA: Diagnosis present

## 2023-04-05 DIAGNOSIS — E669 Obesity, unspecified: Secondary | ICD-10-CM | POA: Diagnosis present

## 2023-04-05 DIAGNOSIS — Z923 Personal history of irradiation: Secondary | ICD-10-CM

## 2023-04-05 DIAGNOSIS — Z87891 Personal history of nicotine dependence: Secondary | ICD-10-CM

## 2023-04-05 DIAGNOSIS — Z9049 Acquired absence of other specified parts of digestive tract: Secondary | ICD-10-CM

## 2023-04-05 DIAGNOSIS — F4322 Adjustment disorder with anxiety: Secondary | ICD-10-CM | POA: Diagnosis present

## 2023-04-05 DIAGNOSIS — Z170 Estrogen receptor positive status [ER+]: Secondary | ICD-10-CM

## 2023-04-05 DIAGNOSIS — D0512 Intraductal carcinoma in situ of left breast: Secondary | ICD-10-CM | POA: Diagnosis present

## 2023-04-05 DIAGNOSIS — Z803 Family history of malignant neoplasm of breast: Secondary | ICD-10-CM | POA: Diagnosis not present

## 2023-04-05 DIAGNOSIS — Z8249 Family history of ischemic heart disease and other diseases of the circulatory system: Secondary | ICD-10-CM

## 2023-04-05 DIAGNOSIS — Z79899 Other long term (current) drug therapy: Secondary | ICD-10-CM

## 2023-04-05 HISTORY — PX: SIMPLE MASTECTOMY WITH AXILLARY SENTINEL NODE BIOPSY: SHX6098

## 2023-04-05 SURGERY — SIMPLE MASTECTOMY
Anesthesia: General | Site: Breast | Laterality: Left

## 2023-04-05 MED ORDER — CEFAZOLIN SODIUM-DEXTROSE 2-4 GM/100ML-% IV SOLN
2.0000 g | INTRAVENOUS | Status: AC
  Administered 2023-04-05: 2 g via INTRAVENOUS
  Filled 2023-04-05: qty 100

## 2023-04-05 MED ORDER — CHLORHEXIDINE GLUCONATE CLOTH 2 % EX PADS: 6.0000 | MEDICATED_PAD | Freq: Once | CUTANEOUS | Status: DC

## 2023-04-05 MED ORDER — DEXAMETHASONE SODIUM PHOSPHATE 10 MG/ML IJ SOLN
INTRAMUSCULAR | Status: DC | PRN
  Administered 2023-04-05: 10 mg via INTRAVENOUS

## 2023-04-05 MED ORDER — AMLODIPINE BESYLATE 5 MG PO TABS
5.0000 mg | ORAL_TABLET | Freq: Every day | ORAL | Status: DC
  Filled 2023-04-05: qty 1

## 2023-04-05 MED ORDER — MIDAZOLAM HCL 2 MG/2ML IJ SOLN
INTRAMUSCULAR | Status: DC | PRN
  Administered 2023-04-05: 2 mg via INTRAVENOUS

## 2023-04-05 MED ORDER — LIDOCAINE 2% (20 MG/ML) 5 ML SYRINGE
INTRAMUSCULAR | Status: DC | PRN
  Administered 2023-04-05: 80 mg via INTRAVENOUS

## 2023-04-05 MED ORDER — AMISULPRIDE (ANTIEMETIC) 5 MG/2ML IV SOLN: 10.0000 mg | Freq: Once | INTRAVENOUS | Status: DC | PRN

## 2023-04-05 MED ORDER — PROPOFOL 10 MG/ML IV BOLUS
INTRAVENOUS | Status: AC
  Filled 2023-04-05: qty 20

## 2023-04-05 MED ORDER — METHOCARBAMOL 500 MG PO TABS
500.0000 mg | ORAL_TABLET | Freq: Four times a day (QID) | ORAL | Status: DC | PRN
  Administered 2023-04-05 (×2): 500 mg via ORAL
  Filled 2023-04-05: qty 1

## 2023-04-05 MED ORDER — ALPRAZOLAM 0.5 MG PO TABS: 0.5000 mg | ORAL_TABLET | Freq: Three times a day (TID) | ORAL | Status: DC | PRN

## 2023-04-05 MED ORDER — PHENYLEPHRINE HCL-NACL 20-0.9 MG/250ML-% IV SOLN
INTRAVENOUS | Status: DC | PRN
  Administered 2023-04-05: 30 ug/min via INTRAVENOUS

## 2023-04-05 MED ORDER — SENNOSIDES-DOCUSATE SODIUM 8.6-50 MG PO TABS
2.0000 | ORAL_TABLET | Freq: Two times a day (BID) | ORAL | Status: DC | PRN
  Administered 2023-04-06: 2 via ORAL
  Filled 2023-04-05: qty 2

## 2023-04-05 MED ORDER — DIPHENHYDRAMINE HCL 50 MG/ML IJ SOLN: 12.5000 mg | Freq: Four times a day (QID) | INTRAMUSCULAR | Status: DC | PRN

## 2023-04-05 MED ORDER — OXYCODONE HCL 5 MG PO TABS
5.0000 mg | ORAL_TABLET | ORAL | Status: DC | PRN
  Administered 2023-04-05 (×2): 5 mg via ORAL
  Administered 2023-04-06: 10 mg via ORAL
  Filled 2023-04-05 (×2): qty 2

## 2023-04-05 MED ORDER — ONDANSETRON HCL 4 MG/2ML IJ SOLN: 4.0000 mg | Freq: Four times a day (QID) | INTRAMUSCULAR | Status: DC | PRN

## 2023-04-05 MED ORDER — SODIUM CHLORIDE 0.9 % IV SOLN: INTRAVENOUS | Status: DC

## 2023-04-05 MED ORDER — ACETAMINOPHEN 500 MG PO TABS
1000.0000 mg | ORAL_TABLET | Freq: Four times a day (QID) | ORAL | Status: DC
  Administered 2023-04-05 – 2023-04-06 (×3): 1000 mg via ORAL
  Filled 2023-04-05 (×3): qty 2

## 2023-04-05 MED ORDER — FENTANYL CITRATE (PF) 100 MCG/2ML IJ SOLN
INTRAMUSCULAR | Status: AC
  Filled 2023-04-05: qty 2

## 2023-04-05 MED ORDER — BUPIVACAINE HCL (PF) 0.25 % IJ SOLN
INTRAMUSCULAR | Status: DC | PRN
  Administered 2023-04-05: 30 mL via PERINEURAL

## 2023-04-05 MED ORDER — FENTANYL CITRATE (PF) 100 MCG/2ML IJ SOLN
25.0000 ug | INTRAMUSCULAR | Status: DC | PRN
  Administered 2023-04-05 (×2): 50 ug via INTRAVENOUS

## 2023-04-05 MED ORDER — OXYCODONE HCL 5 MG PO TABS: 5.0000 mg | ORAL_TABLET | Freq: Once | ORAL | Status: DC | PRN

## 2023-04-05 MED ORDER — OXYCODONE HCL 5 MG PO TABS
ORAL_TABLET | ORAL | Status: AC
  Filled 2023-04-05: qty 1

## 2023-04-05 MED ORDER — METHOCARBAMOL 500 MG PO TABS
ORAL_TABLET | ORAL | Status: AC
  Filled 2023-04-05: qty 1

## 2023-04-05 MED ORDER — FENTANYL CITRATE (PF) 250 MCG/5ML IJ SOLN
INTRAMUSCULAR | Status: AC
  Filled 2023-04-05: qty 5

## 2023-04-05 MED ORDER — ORAL CARE MOUTH RINSE: 15.0000 mL | Freq: Once | OROMUCOSAL | Status: AC

## 2023-04-05 MED ORDER — FENTANYL CITRATE (PF) 250 MCG/5ML IJ SOLN
INTRAMUSCULAR | Status: DC | PRN
  Administered 2023-04-05 (×4): 25 ug via INTRAVENOUS
  Administered 2023-04-05: 50 ug via INTRAVENOUS

## 2023-04-05 MED ORDER — MORPHINE SULFATE (PF) 4 MG/ML IV SOLN: 4.0000 mg | INTRAVENOUS | Status: DC | PRN

## 2023-04-05 MED ORDER — 0.9 % SODIUM CHLORIDE (POUR BTL) OPTIME
TOPICAL | Status: DC | PRN
  Administered 2023-04-05: 1000 mL

## 2023-04-05 MED ORDER — MIDAZOLAM HCL 2 MG/2ML IJ SOLN
INTRAMUSCULAR | Status: AC
  Filled 2023-04-05: qty 2

## 2023-04-05 MED ORDER — ONDANSETRON 4 MG PO TBDP: 4.0000 mg | ORAL_TABLET | Freq: Four times a day (QID) | ORAL | Status: DC | PRN

## 2023-04-05 MED ORDER — TRANEXAMIC ACID 1000 MG/10ML IV SOLN
2000.0000 mg | INTRAVENOUS | Status: DC
  Filled 2023-04-05: qty 20

## 2023-04-05 MED ORDER — TRANEXAMIC ACID 1000 MG/10ML IV SOLN
INTRAVENOUS | Status: DC | PRN
  Administered 2023-04-05: 2000 mg via TOPICAL

## 2023-04-05 MED ORDER — MAGTRACE LYMPHATIC TRACER
INTRAMUSCULAR | Status: DC | PRN
  Administered 2023-04-05: 2 mL via INTRAMUSCULAR

## 2023-04-05 MED ORDER — CHLORHEXIDINE GLUCONATE 0.12 % MT SOLN
15.0000 mL | Freq: Once | OROMUCOSAL | Status: AC
  Administered 2023-04-05: 15 mL via OROMUCOSAL
  Filled 2023-04-05: qty 15

## 2023-04-05 MED ORDER — TRAMADOL HCL 50 MG PO TABS: 50.0000 mg | ORAL_TABLET | Freq: Four times a day (QID) | ORAL | Status: DC | PRN

## 2023-04-05 MED ORDER — ONDANSETRON HCL 4 MG/2ML IJ SOLN
INTRAMUSCULAR | Status: DC | PRN
  Administered 2023-04-05: 4 mg via INTRAVENOUS

## 2023-04-05 MED ORDER — OXYCODONE HCL 5 MG/5ML PO SOLN: 5.0000 mg | Freq: Once | ORAL | Status: DC | PRN

## 2023-04-05 MED ORDER — HYDROCHLOROTHIAZIDE 12.5 MG PO TABS: 12.5000 mg | ORAL_TABLET | Freq: Every day | ORAL | Status: DC | PRN

## 2023-04-05 MED ORDER — PROPOFOL 10 MG/ML IV BOLUS
INTRAVENOUS | Status: DC | PRN
  Administered 2023-04-05: 150 mg via INTRAVENOUS

## 2023-04-05 MED ORDER — DIPHENHYDRAMINE HCL 12.5 MG/5ML PO ELIX: 12.5000 mg | ORAL_SOLUTION | Freq: Four times a day (QID) | ORAL | Status: DC | PRN

## 2023-04-05 MED ORDER — ACETAMINOPHEN 500 MG PO TABS
1000.0000 mg | ORAL_TABLET | ORAL | Status: AC
  Administered 2023-04-05: 1000 mg via ORAL
  Filled 2023-04-05: qty 2

## 2023-04-05 MED ORDER — LACTATED RINGERS IV SOLN: INTRAVENOUS | Status: DC

## 2023-04-05 SURGICAL SUPPLY — 44 items
APL PRP STRL LF DISP 70% ISPRP (MISCELLANEOUS) ×1
APL SKNCLS STERI-STRIP NONHPOA (GAUZE/BANDAGES/DRESSINGS) ×1
APPLIER CLIP 9.375 MED OPEN (MISCELLANEOUS) ×1
APR CLP MED 9.3 20 MLT OPN (MISCELLANEOUS) ×1
BAG COUNTER SPONGE SURGICOUNT (BAG) ×2 IMPLANT
BAG SPNG CNTER NS LX DISP (BAG) ×1
BENZOIN TINCTURE PRP APPL 2/3 (GAUZE/BANDAGES/DRESSINGS) IMPLANT
BINDER BREAST LRG (GAUZE/BANDAGES/DRESSINGS) IMPLANT
BINDER BREAST XLRG (GAUZE/BANDAGES/DRESSINGS) IMPLANT
BIOPATCH RED 1 DISK 7.0 (GAUZE/BANDAGES/DRESSINGS) ×4 IMPLANT
CANISTER SUCT 3000ML PPV (MISCELLANEOUS) ×2 IMPLANT
CHLORAPREP W/TINT 26 (MISCELLANEOUS) ×2 IMPLANT
CLIP APPLIE 9.375 MED OPEN (MISCELLANEOUS) ×2 IMPLANT
COVER SURGICAL LIGHT HANDLE (MISCELLANEOUS) ×2 IMPLANT
DRAIN CHANNEL 19F RND (DRAIN) ×2 IMPLANT
DRAPE LAPAROSCOPIC ABDOMINAL (DRAPES) ×2 IMPLANT
DRSG TEGADERM 4X4.5 CHG (GAUZE/BANDAGES/DRESSINGS) ×4 IMPLANT
DRSG TEGADERM 4X4.75 (GAUZE/BANDAGES/DRESSINGS) IMPLANT
ELECT CAUTERY BLADE 6.4 (BLADE) IMPLANT
ELECT REM PT RETURN 9FT ADLT (ELECTROSURGICAL) ×1
ELECTRODE REM PT RTRN 9FT ADLT (ELECTROSURGICAL) ×2 IMPLANT
EVACUATOR SILICONE 100CC (DRAIN) ×2 IMPLANT
GAUZE PAD ABD 8X10 STRL (GAUZE/BANDAGES/DRESSINGS) ×2 IMPLANT
GAUZE SPONGE 4X4 12PLY STRL (GAUZE/BANDAGES/DRESSINGS) ×2 IMPLANT
GAUZE SPONGE 4X4 12PLY STRL LF (GAUZE/BANDAGES/DRESSINGS) ×2 IMPLANT
GAUZE XEROFORM 1X8 LF (GAUZE/BANDAGES/DRESSINGS) ×4 IMPLANT
GLOVE BIO SURGEON STRL SZ7 (GLOVE) ×2 IMPLANT
GLOVE BIOGEL PI IND STRL 7.5 (GLOVE) ×2 IMPLANT
GOWN STRL REUS W/ TWL LRG LVL3 (GOWN DISPOSABLE) ×4 IMPLANT
GOWN STRL REUS W/TWL LRG LVL3 (GOWN DISPOSABLE) ×2
KIT BASIN OR (CUSTOM PROCEDURE TRAY) ×2 IMPLANT
KIT TURNOVER KIT B (KITS) ×2 IMPLANT
NS IRRIG 1000ML POUR BTL (IV SOLUTION) ×2 IMPLANT
PACK GENERAL/GYN (CUSTOM PROCEDURE TRAY) ×2 IMPLANT
PAD ARMBOARD 7.5X6 YLW CONV (MISCELLANEOUS) ×2 IMPLANT
PENCIL SMOKE EVACUATOR (MISCELLANEOUS) ×2 IMPLANT
SPECIMEN JAR X LARGE (MISCELLANEOUS) ×2 IMPLANT
STRIP CLOSURE SKIN 1/2X4 (GAUZE/BANDAGES/DRESSINGS) IMPLANT
SUT ETHILON 3 0 FSL (SUTURE) ×2 IMPLANT
SUT MNCRL AB 4-0 PS2 18 (SUTURE) IMPLANT
SUT SILK 2 0 SH (SUTURE) ×2 IMPLANT
SUT VIC AB 3-0 SH 18 (SUTURE) ×2 IMPLANT
TOWEL GREEN STERILE (TOWEL DISPOSABLE) ×2 IMPLANT
TOWEL GREEN STERILE FF (TOWEL DISPOSABLE) ×2 IMPLANT

## 2023-04-05 NOTE — Anesthesia Procedure Notes (Signed)
Anesthesia Regional Block: Pectoralis block   Pre-Anesthetic Checklist: , timeout performed,  Correct Patient, Correct Site, Correct Laterality,  Correct Procedure, Correct Position, site marked,  Risks and benefits discussed,  Surgical consent,  Pre-op evaluation,  At surgeon's request and post-op pain management  Laterality: Left  Prep: chloraprep       Needles:  Injection technique: Single-shot  Needle Type: Echogenic Stimulator Needle     Needle Length: 9cm  Needle Gauge: 21     Additional Needles:   Procedures:,,,, ultrasound used (permanent image in chart),,    Narrative:  Start time: 04/05/2023 6:53 AM End time: 04/05/2023 6:57 AM Injection made incrementally with aspirations every 5 mL.  Performed by: Personally  Anesthesiologist: Linton Rump, MD  Additional Notes: Discussed risks and benefits of nerve block including, but not limited to, prolonged and/or permanent nerve injury involving sensory and/or motor function. Monitors were applied and a time-out was performed. The nerve and associated structures were visualized under ultrasound guidance. After negative aspiration, local anesthetic was slowly injected around the nerve. There was no evidence of high pressure during the procedure. There were no paresthesias. VSS remained stable and the patient tolerated the procedure well.

## 2023-04-05 NOTE — Op Note (Signed)
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 Johna Robles.  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  She underwent left lumpectomy and sentinel lymph node biopsy in August 2016.  Margins were negative.  2 lymph nodes were negative.  Oncotype testing was 25.  She underwent radiation and antiestrogen therapy with tamoxifen.   2 weeks ago, she underwent screening mammogram that revealed calcifications in the left breast.  She underwent further workup which revealed 2 adjacent groups of indeterminate calcifications in the lower outer quadrant of the left breast spanning a total of 2.8 cm.  The more anterior group measures 1.0 cm and the smaller posterior group measures 0.5 cm.  She had 2 biopsies of the larger area and one of the small area.  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  Prognostic panel is pending.   The patient was previously treated by Dr. Pamelia Robles and Norma Robles.  The patient was recently found to have a renal mass that is being evaluated for possible renal cell carcinoma.  After discussion, she wants to proceed with a left mastectomy with possible delayed reconstruction after completing her urologic workup.  Dr. Leta Robles is planning a skin sparing incision to help plan for the eventual reconstruction.  We will inject MAC trace in the event that any invasive cancer is found by final pathology and the patient might need a sentinel lymph node biopsy.  Description of procedure: The patient is brought to the operating room and placed in the supine position on the operating room table.  After an adequate level general anesthesia was  obtained, a timeout was taken to ensure the proper patient and proper procedure.  I cleaned the skin around her nipple with alcohol.  We injected MAC trace in the retroareolar space.  Dr. Leta Robles drew a anchor incision.  The breast was then prepped with ChloraPrep and draped sterile fashion.  A timeout was taken again.  We made our incision with the scalpel.  Inferiorly we dissected down to the inframammary crease.  Medially we dissected to the edge of the sternum.  Laterally we dissected to the anterior edge of the latissimus.  Superiorly we dissected the chest wall below the clavicle.  We then dissected the breast off of the underlying pectoralis muscle from medial to lateral.  The specimen was oriented with a long suture lateral and a short suture superior.  We irrigated the wound thoroughly and inspected for hemostasis.  We placed a sponge soaked with TXA into the wound and left this for several minutes to aid in hemostasis.  A 19 French drain was brought in through a stab incision laterally and secured with a 2-0 Ethilon suture.  The TXA sponge was removed.  We closed the wound with multiple interrupted deep 3-0 Vicryl sutures.  4-0 Monocryl was used to close the skin.  Benzoin and Steri-Strips were applied.  A clean dressing is applied.  A breast binder is used.  The drain was placed to suction.  The patient was extubated and brought to the recovery room in stable condition.  All sponge, instrument, and needle counts are correct.  Norma Robles. Norma Skains, MD, [facility]  General Surgery   04/05/2023 9:03 AM

## 2023-04-05 NOTE — Interval H&P Note (Signed)
History and Physical Interval Note:  04/05/2023 7:10 AM  Norma Robles  has presented today for surgery, with the diagnosis of RECURRENT MULTIFOCAL LEFT BREAST DCIS.  The various methods of treatment have been discussed with the patient and family. After consideration of risks, benefits and other options for treatment, the patient has consented to  Procedure(s): LEFT SIMPLE MASTECTOMY (Left) as a surgical intervention.  The patient's history has been reviewed, patient examined, no change in status, stable for surgery.  I have reviewed the patient's chart and labs.  Questions were answered to the patient's satisfaction.     Norma Robles

## 2023-04-05 NOTE — Anesthesia Postprocedure Evaluation (Signed)
Anesthesia Post Note  Patient: Tabria Mua  Procedure(s) Performed: LEFT SIMPLE MASTECTOMY (Left: Breast)     Patient location during evaluation: PACU Anesthesia Type: General Level of consciousness: awake Pain management: pain level controlled Vital Signs Assessment: post-procedure vital signs reviewed and stable Respiratory status: spontaneous breathing, nonlabored ventilation and respiratory function stable Cardiovascular status: blood pressure returned to baseline and stable Postop Assessment: no apparent nausea or vomiting Anesthetic complications: no   No notable events documented.  Last Vitals:  Vitals:   04/05/23 1215 04/05/23 1300  BP: (!) 159/89 126/73  Pulse: 94 82  Resp: 13   Temp:    SpO2: 97% 94%    Last Pain:  Vitals:   04/05/23 1115  TempSrc:   PainSc: Asleep   Pain Goal:                   Linton Rump

## 2023-04-05 NOTE — Anesthesia Procedure Notes (Signed)
Procedure Name: LMA Insertion Date/Time: 04/05/2023 7:44 AM  Performed by: Loleta Keagan Anthis, CRNAPre-anesthesia Checklist: Patient identified, Patient being monitored, Timeout performed, Emergency Drugs available and Suction available Patient Re-evaluated:Patient Re-evaluated prior to induction Oxygen Delivery Method: Circle system utilized Preoxygenation: Pre-oxygenation with 100% oxygen Induction Type: IV induction Ventilation: Mask ventilation without difficulty LMA: LMA inserted LMA Size: 4.0 Tube type: Oral Number of attempts: 1 Placement Confirmation: positive ETCO2 and breath sounds checked- equal and bilateral Tube secured with: Tape Dental Injury: Teeth and Oropharynx as per pre-operative assessment

## 2023-04-05 NOTE — Transfer of Care (Signed)
Immediate Anesthesia Transfer of Care Note  Patient: Norma Robles  Procedure(s) Performed: LEFT SIMPLE MASTECTOMY (Left: Breast)  Patient Location: PACU  Anesthesia Type:General  Level of Consciousness: sedated  Airway & Oxygen Therapy: Patient Spontanous Breathing and Patient connected to face mask oxygen  Post-op Assessment: Report given to RN and Post -op Vital signs reviewed and stable  Post vital signs: Reviewed and stable  Last Vitals:  Vitals Value Taken Time  BP 120/61 04/05/23 0924  Temp    Pulse 74 04/05/23 0926  Resp 10 04/05/23 0926  SpO2 99 % 04/05/23 0926  Vitals shown include unvalidated device data.  Last Pain:  Vitals:   04/05/23 0613  TempSrc:   PainSc: 0-No pain         Complications: No notable events documented.

## 2023-04-06 ENCOUNTER — Other Ambulatory Visit (HOSPITAL_COMMUNITY): Payer: Self-pay

## 2023-04-06 ENCOUNTER — Encounter (HOSPITAL_COMMUNITY): Payer: Self-pay | Admitting: Surgery

## 2023-04-06 NOTE — Discharge Summary (Signed)
Physician Discharge Summary  Patient ID: Norma Robles MRN: [id number] DOB/AGE: 1961/09/01 62 y.o.  Admit date: 04/05/2023 Discharge date: 04/06/2023  Admission Diagnoses:  Left breast ductal carcinoma in situ  Discharge Diagnoses: Same Principal Problem:   Ductal carcinoma in situ (DCIS) of left breast   Discharged Condition: good  Hospital Course: Left mastectomy 04/05/23.  Did well overnight.  Pain well-controlled.  No sign of hematoma.   Treatments: surgery: left mastectomy  Discharge Exam: Blood pressure 102/67, pulse 75, temperature 98.2 F (36.8 C), temperature source Oral, resp. rate 17, height 4' 11.35" (1.507 m), weight 73.9 kg, last menstrual period 04/14/2015, SpO2 97 %. General appearance: alert, cooperative, and no distress Left chest - skin flaps viable; no hematoma; drain serosanguinous  Disposition: Discharge disposition: 01-Home or Self Care       Discharge Instructions     Call MD for:  persistant nausea and vomiting   Complete by: As directed    Call MD for:  redness, tenderness, or signs of infection (pain, swelling, redness, odor or green/yellow discharge around incision site)   Complete by: As directed    Call MD for:  severe uncontrolled pain   Complete by: As directed    Call MD for:  temperature >100.4   Complete by: As directed    Diet general   Complete by: As directed    Discharge wound care:   Complete by: As directed    Dry gauze over incision/ breast binder Empty and record drain output - bring this information with you to the office next week.   Driving Restrictions   Complete by: As directed    Do not drive while taking pain medications   Increase activity slowly   Complete by: As directed       Allergies as of 04/06/2023       Reactions   Codeine Nausea Only        Medication List     TAKE these medications    Advil Cold/Sinus 30-200 MG Tabs Generic drug: Pseudoephedrine-Ibuprofen Take 1 tablet by mouth daily as needed  (Allergies).   ALPRAZolam 0.5 MG tablet Commonly known as: XANAX Take 0.5 mg by mouth daily as needed for anxiety.   amLODipine 5 MG tablet Commonly known as: NORVASC Take 5 mg by mouth daily.   Dry Eye Relief Drops 0.2-0.2-1 % Soln Generic drug: Glycerin-Hypromellose-PEG 400 Place 1 drop into both eyes daily as needed (Dry eye).   hydrochlorothiazide 12.5 MG capsule Commonly known as: MICROZIDE Take 12.5 mg by mouth daily as needed (Swelling).   sennosides-docusate sodium 8.6-50 MG tablet Commonly known as: SENOKOT-S Take 2 tablets by mouth 2 (two) times daily as needed for constipation.   tamoxifen 20 MG tablet Commonly known as: NOLVADEX Take 1 tablet (20 mg total) by mouth daily.   Vitamin D 50 MCG (2000 UT) Caps Take 1 capsule (2,000 Units total) by mouth daily.               Discharge Care Instructions  (From admission, onward)           Start     Ordered   04/06/23 0000  Discharge wound care:       Comments: Dry gauze over incision/ breast binder Empty and record drain output - bring this information with you to the office next week.   04/06/23 0859             Signed: Wilmon Arms Brytani Voth 04/06/2023, 9:00 AM

## 2023-04-06 NOTE — Plan of Care (Signed)

## 2023-04-06 NOTE — Progress Notes (Signed)
Patient and family given discharge instructions and verbalized understanding. Patient able to demonstrate drain care. Patient discharging home with family. PIV removed and assisted with dressing.

## 2023-04-06 NOTE — Discharge Instructions (Signed)
[facility] [phone]  MASTECTOMY: POST OP INSTRUCTIONS  Always review your discharge instruction sheet given to you by the facility where your surgery was performed. IF YOU HAVE DISABILITY OR FAMILY LEAVE FORMS, YOU MUST BRING THEM TO THE OFFICE FOR PROCESSING.   DO NOT GIVE THEM TO YOUR DOCTOR. A prescription for pain medication may be given to you upon discharge.  Take your pain medication as prescribed, if needed.  If narcotic pain medicine is not needed, then you may take acetaminophen (Tylenol) or ibuprofen (Advil) as needed. Take your usually prescribed medications unless otherwise directed. If you need a refill on your pain medication, please contact your pharmacy.  They will contact our office to request authorization.  Prescriptions will not be filled after 5pm or on week-ends. You should follow a light diet the first few days after arrival home, such as soup and crackers, etc.  Resume your normal diet the day after surgery. Most patients will experience some swelling and bruising on the chest and underarm.  Ice packs will help.  Swelling and bruising can take several days to resolve.  It is common to experience some constipation if taking pain medication after surgery.  Increasing fluid intake and taking a stool softener (such as Colace) will usually help or prevent this problem from occurring.  A mild laxative (Milk of Magnesia or Miralax) should be taken according to package instructions if there are no bowel movements after 48 hours. Unless discharge instructions indicate otherwise, leave your bandage dry and in place until your next appointment in 3-5 days.  You may take a limited sponge bath.  No tube baths or showers until the drains are removed.  You may have steri-strips (small skin tapes) in place directly over the incision.  These strips should be left on the skin for 7-10 days.  If your surgeon used skin glue on the incision, you may shower in 24 hours.   The glue will flake off over the next 2-3 weeks.  Any sutures or staples will be removed at the office during your follow-up visit. DRAINS:  If you have drains in place, it is important to keep a list of the amount of drainage produced each day in your drains.  Before leaving the hospital, you should be instructed on drain care.  Call our office if you have any questions about your drains. ACTIVITIES:  You may resume regular (light) daily activities beginning the next day--such as daily self-care, walking, climbing stairs--gradually increasing activities as tolerated.  You may have sexual intercourse when it is comfortable.  Refrain from any heavy lifting or straining until approved by your doctor. You may drive when you are no longer taking prescription pain medication, you can comfortably wear a seatbelt, and you can safely maneuver your car and apply brakes. RETURN TO WORK:  __________________________________________________________ You should see your doctor in the office for a follow-up appointment approximately 3-5 days after your surgery.  Your doctor's nurse will typically make your follow-up appointment when she calls you with your pathology report.  Expect your pathology report 2-3 business days after your surgery.  You may call to check if you do not hear from us after three days.   OTHER INSTRUCTIONS: ______________________________________________________________________________________________ ____________________________________________________________________________________________ WHEN TO CALL YOUR DOCTOR: Fever over 101.0 Nausea and/or vomiting Extreme swelling or bruising Continued bleeding from incision. Increased pain, redness, or drainage from the incision. The clinic staff is available to answer your questions during regular business hours.  Please don't hesitate   to call and ask to speak to one of the nurses for clinical concerns.  If you have a medical emergency, go to the  nearest emergency room or call 911.  A surgeon from [facility] is always on call at the hospital. [address], [address], [place], [place]  [address] ? [address] [address], [place], [place]   [address] [phone] ? [phone] ? FAX [phone] Web site: [email or web address]  

## 2023-04-07 ENCOUNTER — Other Ambulatory Visit: Payer: Self-pay

## 2023-04-10 LAB — SURGICAL PATHOLOGY

## 2023-04-11 ENCOUNTER — Encounter: Payer: Self-pay | Admitting: *Deleted

## 2023-04-11 DIAGNOSIS — Z170 Estrogen receptor positive status [ER+]: Secondary | ICD-10-CM

## 2023-04-17 ENCOUNTER — Other Ambulatory Visit: Payer: Self-pay

## 2023-04-17 ENCOUNTER — Inpatient Hospital Stay: Payer: BC Managed Care – PPO | Admitting: Hematology and Oncology

## 2023-04-17 VITALS — BP 138/85 | HR 101 | Temp 97.8°F | Resp 18 | Ht 59.35 in | Wt 163.5 lb

## 2023-04-17 DIAGNOSIS — Z170 Estrogen receptor positive status [ER+]: Secondary | ICD-10-CM | POA: Diagnosis not present

## 2023-04-17 DIAGNOSIS — M810 Age-related osteoporosis without current pathological fracture: Secondary | ICD-10-CM | POA: Diagnosis not present

## 2023-04-17 DIAGNOSIS — C50412 Malignant neoplasm of upper-outer quadrant of left female breast: Secondary | ICD-10-CM

## 2023-04-17 DIAGNOSIS — Z79899 Other long term (current) drug therapy: Secondary | ICD-10-CM | POA: Diagnosis not present

## 2023-04-17 DIAGNOSIS — Z923 Personal history of irradiation: Secondary | ICD-10-CM | POA: Diagnosis not present

## 2023-04-17 DIAGNOSIS — Z79810 Long term (current) use of selective estrogen receptor modulators (SERMs): Secondary | ICD-10-CM | POA: Diagnosis not present

## 2023-04-17 NOTE — Assessment & Plan Note (Addendum)
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 Hot flashes: Intermittent. 2. Fatigue   Severe osteoporosis: T score -3 She is currently on Fosamax 70 mg weekly along with calcium and vitamin D   Recurrence: DCIS 1.  Mammogram 11/07/22: Left Breast Calcs 2. 11/16/22: Biopsies X 3: IG-HG DCIS ER/PR Positive 3.  04/05/2023: Left mastectomy: Intermediate to high-grade DCIS 2.5 cm, margins negative, ER 95%, PR 95%  04/03/2023: MRI abdomen: 2.4 cm enhancing exophytic mass right mid to lower kidney: Differential intracerebral adipose tissue versus atypical/lipid poor angiomyolipoma versus renal cell carcinoma: Urology referral made.  Continue with tamoxifen and follow-up with me in 6 months

## 2023-05-02 ENCOUNTER — Other Ambulatory Visit (HOSPITAL_COMMUNITY): Payer: Self-pay | Admitting: Urology

## 2023-05-02 DIAGNOSIS — D49511 Neoplasm of unspecified behavior of right kidney: Secondary | ICD-10-CM

## 2023-05-04 NOTE — Progress Notes (Signed)
Oley Balm, MD  Leodis Rains D PROCEDURE / BIOPSY REVIEW Date: 05/04/23  Requested Biopsy site: renal RLP mass Reason for request: rcca v aml Imaging review: Best seen on CT 03/29/23  Decision: Approved Imaging modality to perform: CT Schedule with: Moderate Sedation Schedule for: Any VIR  Additional comments:   Please contact me with questions, concerns, or if issue pertaining to this request arise.  Dayne Oley Balm, MD [facility]

## 2023-05-15 ENCOUNTER — Other Ambulatory Visit: Payer: Self-pay | Admitting: Student

## 2023-05-15 DIAGNOSIS — Z419 Encounter for procedure for purposes other than remedying health state, unspecified: Secondary | ICD-10-CM

## 2023-05-16 ENCOUNTER — Other Ambulatory Visit (HOSPITAL_COMMUNITY): Payer: Self-pay | Admitting: Urology

## 2023-05-16 ENCOUNTER — Ambulatory Visit (HOSPITAL_COMMUNITY)
Admission: RE | Admit: 2023-05-16 | Discharge: 2023-05-16 | Disposition: A | Discharge status: Home / Self Care | Payer: BC Managed Care – PPO | Source: Ambulatory Visit | Attending: Urology | Admitting: Urology

## 2023-05-16 DIAGNOSIS — Z853 Personal history of malignant neoplasm of breast: Secondary | ICD-10-CM | POA: Insufficient documentation

## 2023-05-16 DIAGNOSIS — C649 Malignant neoplasm of unspecified kidney, except renal pelvis: Secondary | ICD-10-CM | POA: Insufficient documentation

## 2023-05-16 DIAGNOSIS — D49511 Neoplasm of unspecified behavior of right kidney: Secondary | ICD-10-CM

## 2023-05-16 DIAGNOSIS — Z419 Encounter for procedure for purposes other than remedying health state, unspecified: Secondary | ICD-10-CM

## 2023-05-16 LAB — CBC
HCT: 41.6 % (ref 36.0–46.0)
Hemoglobin: 13.8 g/dL (ref 12.0–15.0)
MCH: 29.9 pg (ref 26.0–34.0)
MCHC: 33.2 g/dL (ref 30.0–36.0)
MCV: 90 fL (ref 80.0–100.0)
Platelets: 278 10*3/uL (ref 150–400)
RBC: 4.62 MIL/uL (ref 3.87–5.11)
RDW: 12.9 % (ref 11.5–15.5)
WBC: 6.2 10*3/uL (ref 4.0–10.5)
nRBC: 0 % (ref 0.0–0.2)

## 2023-05-16 LAB — PROTIME-INR
INR: 0.9 (ref 0.8–1.2)
Prothrombin Time: 12.4 seconds (ref 11.4–15.2)

## 2023-05-16 MED ORDER — LIDOCAINE HCL (PF) 1 % IJ SOLN
10.0000 mL | Freq: Once | INTRAMUSCULAR | Status: AC
  Administered 2023-05-16: 10 mL via INTRADERMAL

## 2023-05-16 MED ORDER — MIDAZOLAM HCL 2 MG/2ML IJ SOLN
INTRAMUSCULAR | Status: AC | PRN
  Administered 2023-05-16: 1 mg via INTRAVENOUS
  Administered 2023-05-16: .5 mg via INTRAVENOUS

## 2023-05-16 MED ORDER — SODIUM CHLORIDE 0.9 % IV SOLN: INTRAVENOUS | Status: DC

## 2023-05-16 MED ORDER — FENTANYL CITRATE (PF) 100 MCG/2ML IJ SOLN
INTRAMUSCULAR | Status: AC
  Filled 2023-05-16: qty 2

## 2023-05-16 MED ORDER — MIDAZOLAM HCL 2 MG/2ML IJ SOLN
INTRAMUSCULAR | Status: AC
  Filled 2023-05-16: qty 2

## 2023-05-16 MED ORDER — FENTANYL CITRATE (PF) 100 MCG/2ML IJ SOLN
INTRAMUSCULAR | Status: AC | PRN
  Administered 2023-05-16 (×3): 25 ug via INTRAVENOUS

## 2023-05-16 NOTE — Procedures (Signed)
Interventional Radiology Procedure Note  Date of Procedure: 05/16/2023  Procedure: US renal mass biopsy   Findings:  1. US renal mass biopsy  18ga cores x3  2. Gelfoam slurry administered for hemastasis   Complications: No immediate complications noted.   Estimated Blood Loss: minimal  Follow-up and Recommendations: 1. Bedrest 2 hours flat    Olive Bass, MD  [facility] Interventional Radiology  05/16/2023 10:38 AM

## 2023-05-16 NOTE — H&P (Signed)
Chief Complaint: Patient was seen in consultation today for renal mass biopsy  Referring Physician(s): Winter,Christopher Clifton Custard  Supervising Physician: Pernell Dupre  Patient Status: [facility] - Out-pt  History of Present Illness: Norma Robles is a 61 y.o. female with a medical history significant for HTN, anxiety and left breast cancer diagnosed in 2016 and treated with lumpectomy, radiation and anti-estrogen therapy.   A screening mammogram in January 2024 detected left breast calcifications that were positive for high grade DCIS. She had a CT chest/abdomen/pelvis to assess for metastatic disease and a 2.2 cm right kidney mass was seen. An MR abdomen was ordered for further evaluation and She underwent a left mastectomy 04/05/23.   MR Abdomen 04/03/23 IMPRESSION: 1. 2.4 by 2.2 by 2.3 cm diffusely enhancing exophytic mass of the right mid to lower kidney laterally, with a very small fatty component posteriorly. This is indeterminate for macroscopic versus microscopic/intracellular adipose tissue, with differential diagnosis including atypical/lipid poor angiomyolipoma versus renal cell carcinoma. Urologic consultation is recommended. 2. Diffuse hepatic steatosis.  Interventional Radiology has been asked to evaluate this patient for an image-guided right renal mass biopsy. Imaging reviewed and procedure approved by Dr. Deanne Coffer.   Past Medical History:  Diagnosis Date   Anxiety    Breast cancer (HCC) 2016   Left Breast Cancer   Breast cancer of upper-outer quadrant of left female breast (HCC) 06/04/2015   Ductal carcinoma in situ (DCIS) of left breast 2024   Hypertension    Personal history of radiation therapy 2016   Left Breast Cancer   Thyroid nodule 2019    Past Surgical History:  Procedure Laterality Date   BREAST BIOPSY Left 06/02/2015   BREAST BIOPSY Left 11/16/2022   MM LT BREAST BX W LOC DEV 1ST LESION IMAGE BX SPEC STEREO GUIDE 11/16/2022 [facility]    BREAST BIOPSY Left 11/16/2022   MM LT BREAST BX W LOC DEV EA AD LESION IMG BX SPEC STEREO GUIDE 11/16/2022 [facility]   BREAST BIOPSY Left 11/16/2022   MM LT BREAST BX W LOC DEV EA AD LESION IMG BX SPEC STEREO GUIDE 11/16/2022 [facility]   BREAST LUMPECTOMY Left 06/17/2015   BREAST LUMPECTOMY WITH RADIOACTIVE SEED AND SENTINEL LYMPH NODE BIOPSY Left 06/17/2015   Procedure: RADIOACTIVE SEED LOCALIZATION LEFT BREAST LUMPECTOMY AND LEFT AXILLARY SENTINEL LYMPH NODE BIOPSY;  Surgeon: Glenna Fellows, MD;  Location: [facility];  Service: General;  Laterality: Left;   CERVICAL POLYPECTOMY  2016   CHOLECYSTECTOMY     SIMPLE MASTECTOMY WITH AXILLARY SENTINEL NODE BIOPSY Left 04/05/2023   Procedure: LEFT SIMPLE MASTECTOMY;  Surgeon: Manus Rudd, MD;  Location: [facility] OR;  Service: General;  Laterality: Left;   TONSILLECTOMY     UTERINE FIBROID EMBOLIZATION      Allergies: Codeine  Medications: Prior to Admission medications   Medication Sig Start Date End Date Taking? Authorizing Provider  amLODipine (NORVASC) 5 MG tablet Take 5 mg by mouth daily.   Yes [provider]  Cholecalciferol (VITAMIN D) 2000 units CAPS Take 1 capsule (2,000 Units total) by mouth daily. 05/08/18  Yes Serena Croissant, MD  Pseudoephedrine-Ibuprofen (ADVIL COLD/SINUS) 30-200 MG TABS Take 1 tablet by mouth daily as needed (Allergies).   Yes [provider]  sennosides-docusate sodium (SENOKOT-S) 8.6-50 MG tablet Take 2 tablets by mouth 2 (two) times daily as needed for constipation.   Yes [provider]  tamoxifen (NOLVADEX) 20 MG tablet Take 1 tablet (20 mg total) by mouth daily. 12/05/22  Yes Serena Croissant, MD  ALPRAZolam Prudy Feeler) 0.5 MG tablet Take 0.5 mg by mouth daily as needed for anxiety. 11/27/22   [provider]  Glycerin-Hypromellose-PEG 400 (DRY EYE RELIEF DROPS) 0.2-0.2-1 % SOLN Place 1 drop into both eyes daily as needed (Dry eye).    [provider]  hydrochlorothiazide (MICROZIDE) 12.5 MG capsule Take 12.5 mg by mouth daily as needed (Swelling). 12/15/14   [provider]     Family History  Problem Relation Age of Onset   Uterine cancer Mother 53   Skin cancer Mother        non-melanoma; on hand   Prostate cancer Brother 20   Breast cancer Maternal Aunt 75       mets   Cancer Paternal Aunt        ? breast cancer; dx ~50   Breast cancer Daughter 22       neg GT   Prostate cancer Cousin 76    Social History   Socioeconomic History   Marital status: Single    Spouse name: Not on file   Number of children: 2   Years of education: Not on file   Highest education level: Not on file  Occupational History   Not on file  Tobacco Use   Smoking status: Former    Current packs/day: 0.00    Average packs/day: 0.3 packs/day for 10.0 years (2.5 ttl pk-yrs)    Types: Cigarettes    Start date: 11/13/1984    Quit date: 11/13/1994    Years since quitting: 28.5   Smokeless tobacco: Never  Vaping Use   Vaping status: Never Used  Substance and Sexual Activity   Alcohol use: Not Currently   Drug use: No   Sexual activity: Yes    Birth control/protection: Post-menopausal  Other Topics Concern   Not on file  Social History Narrative   Not on file   Social Determinants of Health   Financial Resource Strain: Not on file  Food Insecurity: Not on file  Transportation Needs: Not on file  Physical Activity: Not on file  Stress: Not on file  Social Connections: Unknown (03/14/2022)   Received from [facility]   Social Network    Social Network: Not on file    Review of Systems: A 12 point ROS discussed and pertinent positives are indicated in the HPI above.  All other systems are negative.  Review of Systems  Constitutional:  Negative for appetite change and fatigue.  Respiratory:  Negative for cough and shortness of breath.   Cardiovascular:  Negative for chest pain and leg swelling.   Gastrointestinal:  Negative for abdominal pain, diarrhea, nausea and vomiting.  Musculoskeletal:  Negative for back pain.  Skin:        Left mastectomy  Neurological:  Negative for dizziness and headaches.    Vital Signs: BP (!) 156/80   Pulse 80   Temp 98.4 F (36.9 C)   Resp 18   Ht 4' 11.5" (1.511 m)   Wt 165 lb (74.8 kg)   LMP 04/14/2015 Comment: polyp bleeding noted during vaginal ultrasound.  SpO2 97%   BMI 32.77 kg/m   Physical Exam Constitutional:      General: She is not in acute distress.    Appearance: She is not ill-appearing.  HENT:     Mouth/Throat:     Mouth: Mucous membranes are moist.     Pharynx: Oropharynx is clear.  Cardiovascular:     Rate and Rhythm: Normal  rate and regular rhythm.     Pulses: Normal pulses.     Heart sounds: Normal heart sounds.  Pulmonary:     Effort: Pulmonary effort is normal.     Breath sounds: Normal breath sounds.  Abdominal:     General: Bowel sounds are normal.     Palpations: Abdomen is soft.     Tenderness: There is no abdominal tenderness.  Musculoskeletal:     Right lower leg: No edema.     Left lower leg: No edema.     Comments: Left mastectomy  Skin:    General: Skin is warm and dry.  Neurological:     Mental Status: She is alert and oriented to person, place, and time.     Imaging: No results found.  Labs:  CBC: Recent Labs    03/29/23 1456 05/16/23 0841  WBC 8.7 6.2  HGB 14.3 13.8  HCT 42.8 41.6  PLT 315 278    COAGS: Recent Labs    05/16/23 0841  INR 0.9    BMP: Recent Labs    03/29/23 0945 03/29/23 1456  NA  --  138  K  --  3.8  CL  --  105  CO2  --  25  GLUCOSE  --  97  BUN  --  10  CALCIUM  --  9.4  CREATININE 0.60 0.73  GFRNONAA  --  >60    LIVER FUNCTION TESTS: No results for input(s): "BILITOT", "AST", "ALT", "ALKPHOS", "PROT", "ALBUMIN" in the last 8760 hours.  TUMOR MARKERS: No results for input(s): "AFPTM", "CEA", "CA199", "CHROMGRNA" in the last 8760  hours.  Assessment and Plan:  History of breast cancer; right renal mass: Karis Rilling, 62 year old female, presents today to the [facility] Interventional Radiology department for an image-guided right renal mass biopsy.   Risks and benefits of this procedure were discussed with the patient and/or patient's family including, but not limited to bleeding, infection, damage to adjacent structures or low yield requiring additional tests.  All of the questions were answered and there is agreement to proceed. She has been NPO. She is a full code. She does not take any blood-thinning medications.   Consent signed and in chart.  Thank you for this interesting consult.  I greatly enjoyed meeting Saphyre Cillo and look forward to participating in their care.  A copy of this report was sent to the requesting provider on this date.  Electronically Signed: Alwyn Ren, AGACNP-BC [phone] 05/16/2023, 9:36 AM   I spent a total of  30 Minutes   in face to face in clinical consultation, greater than 50% of which was counseling/coordinating care for renal mass biopsy.

## 2023-05-17 LAB — SURGICAL PATHOLOGY

## 2023-05-24 ENCOUNTER — Other Ambulatory Visit: Payer: Self-pay | Admitting: Urology

## 2023-06-13 ENCOUNTER — Telehealth: Payer: Self-pay

## 2023-06-13 NOTE — Telephone Encounter (Signed)
Pt called to advise she is having a nephrectomy and asks for advice on holding Tamoxifen. Per MD Hold 2 weeks prior to surgery and restart one week after. She verbalized understanding.

## 2023-06-14 ENCOUNTER — Encounter (HOSPITAL_COMMUNITY): Payer: BC Managed Care – PPO

## 2023-06-14 NOTE — Progress Notes (Addendum)
Anesthesia Review:  PCP: Zoe Lan  Cardiologist : none  Chest x-ray : CT chest- 03/30/23  EKG : 03/29/23  Echo : Stress test: Cardiac Cath :  Activity level:  can do a flgiht of stairs without difficutly  Sleep Study/ CPAP : none  Fasting Blood Sugar :      / Checks Blood Sugar -- times a day:   Blood Thinner/ Instructions /Last Dose: ASA / Instructions/ Last Dose :    Recent left mastectomy- 03/2023- No B/P etc to left arm

## 2023-06-18 NOTE — Patient Instructions (Signed)
SURGICAL WAITING ROOM VISITATION  Patients having surgery or a procedure may have no more than 2 support people in the waiting area - these visitors may rotate.    Children under the age of 90 must have an adult with them who is not the patient.  Due to an increase in RSV and influenza rates and associated hospitalizations, children ages 58 and under may not visit patients in [facility] hospitals.  If the patient needs to stay at the hospital during part of their recovery, the visitor guidelines for inpatient rooms apply. Pre-op nurse will coordinate an appropriate time for 1 support person to accompany patient in pre-op.  This support person may not rotate.    Please refer to the [facility] website for the visitor guidelines for Inpatients (after your surgery is over and you are in a regular room).       Your procedure is scheduled on: 06/29/2023    Report to [facility] Main Entrance    Report to admitting at   1030AM   Call this number if you have problems the morning of surgery [phone]     Clear liquid diet the day before surgery.     Nothing after midnite the nite before surgery.   Water Non-Citrus Juices (without pulp, NO RED-Apple, White grape, White cranberry) Black Coffee (NO MILK/CREAM OR CREAMERS, sugar ok)  Clear Tea (NO MILK/CREAM OR CREAMERS, sugar ok) regular and decaf                             Plain Jell-O (NO RED)                                           Fruit ices (not with fruit pulp, NO RED)                                     Popsicles (NO RED)                                                               Sports drinks like Gatorade (NO RED)                          If you have questions, please contact your surgeon's office.        Oral Hygiene is also important to reduce your risk of infection.                                    Remember - BRUSH YOUR TEETH THE MORNING OF SURGERY WITH YOUR REGULAR TOOTHPASTE  DENTURES WILL BE  REMOVED PRIOR TO SURGERY PLEASE DO NOT APPLY "Poly grip" OR ADHESIVES!!!   Do NOT smoke after Midnight   Stop all vitamins and herbal supplements 7 days before surgery.   Take these medicines the morning of surgery with A SIP OF WATER:  amlodpine, tamoxifen   DO NOT TAKE ANY ORAL DIABETIC MEDICATIONS DAY OF  YOUR SURGERY  Bring CPAP mask and tubing day of surgery.                              You may not have any metal on your body including hair pins, jewelry, and body piercing             Do not wear make-up, lotions, powders, perfumes/cologne, or deodorant  Do not wear nail polish including gel and S&S, artificial/acrylic nails, or any other type of covering on natural nails including finger and toenails. If you have artificial nails, gel coating, etc. that needs to be removed by a nail salon please have this removed prior to surgery or surgery may need to be canceled/ delayed if the surgeon/ anesthesia feels like they are unable to be safely monitored.   Do not shave  48 hours prior to surgery.               Men may shave face and neck.   Do not bring valuables to the hospital. [facility] IS NOT             RESPONSIBLE   FOR VALUABLES.   Contacts, glasses, dentures or bridgework may not be worn into surgery.   Bring small overnight bag day of surgery.   DO NOT BRING YOUR HOME MEDICATIONS TO THE HOSPITAL. PHARMACY WILL DISPENSE MEDICATIONS LISTED ON YOUR MEDICATION LIST TO YOU DURING YOUR ADMISSION IN THE HOSPITAL!    Patients discharged on the day of surgery will not be allowed to drive home.  Someone NEEDS to stay with you for the first 24 hours after anesthesia.   Special Instructions: Bring a copy of your healthcare power of attorney and living will documents the day of surgery if you haven't scanned them before.              Please read over the following fact sheets you were given: IF YOU HAVE QUESTIONS ABOUT YOUR PRE-OP INSTRUCTIONS PLEASE CALL [phone]   If you  received a COVID test during your pre-op visit  it is requested that you wear a mask when out in public, stay away from anyone that may not be feeling well and notify your surgeon if you develop symptoms. If you test positive for Covid or have been in contact with anyone that has tested positive in the last 10 days please notify you surgeon.    [place] - Preparing for Surgery Before surgery, you can play an important role.  Because skin is not sterile, your skin needs to be as free of germs as possible.  You can reduce the number of germs on your skin by washing with CHG (chlorahexidine gluconate) soap before surgery.  CHG is an antiseptic cleaner which kills germs and bonds with the skin to continue killing germs even after washing. Please DO NOT use if you have an allergy to CHG or antibacterial soaps.  If your skin becomes reddened/irritated stop using the CHG and inform your nurse when you arrive at Short Stay. Do not shave (including legs and underarms) for at least 48 hours prior to the first CHG shower.  You may shave your face/neck. Please follow these instructions carefully:  1.  Shower with CHG Soap the night before surgery and the  morning of Surgery.  2.  If you choose to wash your hair, wash your hair first as usual with your  normal  shampoo.  3.  After you shampoo, rinse your hair and body thoroughly to remove the  shampoo.                           4.  Use CHG as you would any other liquid soap.  You can apply chg directly  to the skin and wash                       Gently with a scrungie or clean washcloth.  5.  Apply the CHG Soap to your body ONLY FROM THE NECK DOWN.   Do not use on face/ open                           Wound or open sores. Avoid contact with eyes, ears mouth and genitals (private parts).                       Wash face,  Genitals (private parts) with your normal soap.             6.  Wash thoroughly, paying special attention to the area where your surgery  will be  performed.  7.  Thoroughly rinse your body with warm water from the neck down.  8.  DO NOT shower/wash with your normal soap after using and rinsing off  the CHG Soap.                9.  Pat yourself dry with a clean towel.            10.  Wear clean pajamas.            11.  Place clean sheets on your bed the night of your first shower and do not  sleep with pets. Day of Surgery : Do not apply any lotions/deodorants the morning of surgery.  Please wear clean clothes to the hospital/surgery center.  FAILURE TO FOLLOW THESE INSTRUCTIONS MAY RESULT IN THE CANCELLATION OF YOUR SURGERY PATIENT SIGNATURE_________________________________  NURSE SIGNATURE__________________________________  ________________________________________________________________________

## 2023-06-19 ENCOUNTER — Encounter (HOSPITAL_COMMUNITY): Payer: Self-pay

## 2023-06-19 ENCOUNTER — Encounter (HOSPITAL_COMMUNITY)
Admission: RE | Admit: 2023-06-19 | Discharge: 2023-06-19 | Disposition: A | Discharge status: Home / Self Care | Payer: BC Managed Care – PPO | Source: Ambulatory Visit | Attending: Urology | Admitting: Urology

## 2023-06-19 ENCOUNTER — Other Ambulatory Visit: Payer: Self-pay

## 2023-06-19 VITALS — BP 130/77 | HR 78 | Temp 97.9°F | Resp 16 | Ht 59.75 in | Wt 164.0 lb

## 2023-06-19 DIAGNOSIS — Z01812 Encounter for preprocedural laboratory examination: Secondary | ICD-10-CM | POA: Insufficient documentation

## 2023-06-19 DIAGNOSIS — Z01818 Encounter for other preprocedural examination: Secondary | ICD-10-CM

## 2023-06-19 HISTORY — DX: Pneumonia, unspecified organism: J18.9

## 2023-06-19 LAB — CBC
HCT: 43 % (ref 36.0–46.0)
Hemoglobin: 13.8 g/dL (ref 12.0–15.0)
MCH: 29 pg (ref 26.0–34.0)
MCHC: 32.1 g/dL (ref 30.0–36.0)
MCV: 90.3 fL (ref 80.0–100.0)
Platelets: 305 10*3/uL (ref 150–400)
RBC: 4.76 MIL/uL (ref 3.87–5.11)
RDW: 12.9 % (ref 11.5–15.5)
WBC: 6.4 10*3/uL (ref 4.0–10.5)
nRBC: 0 % (ref 0.0–0.2)

## 2023-06-19 LAB — BASIC METABOLIC PANEL
Anion gap: 9 (ref 5–15)
BUN: 17 mg/dL (ref 8–23)
CO2: 24 mmol/L (ref 22–32)
Calcium: 9.9 mg/dL (ref 8.9–10.3)
Chloride: 105 mmol/L (ref 98–111)
Creatinine, Ser: 0.71 mg/dL (ref 0.44–1.00)
GFR, Estimated: >60 mL/min (ref >60)
Glucose, Bld: 107 mg/dL — ABNORMAL HIGH (ref 70–99)
Potassium: 4.9 mmol/L (ref 3.5–5.1)
Sodium: 138 mmol/L (ref 135–145)

## 2023-06-21 ENCOUNTER — Encounter (HOSPITAL_COMMUNITY): Payer: Self-pay

## 2023-06-28 NOTE — H&P (Signed)
Office Visit Report     06/19/2023   --------------------------------------------------------------------------------   Norma Robles  MRN: [id number]  DOB: [birth year], 62 year old Female  PRIMARY CARE:  Bernadette Hoit, MD  PRIMARY CARE FAX:  [phone]  REFERRING:  Sabas Sous, MD  PROVIDER:  Rhoderick Moody, M.D.  TREATING:  Bartholomew Crews, NP  LOCATION:  [facility] [phone]     --------------------------------------------------------------------------------   CC/HPI: Renal mass   Norma Robles is a 62 year old female with a solid and enhancing renal mass measuring 2.2 cm involving the RIGHT kidney.   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 Corliss Skains. 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 She underwent left lumpectomy and sentinel lymph node biopsy in August 2016. Margins were negative. 2 lymph nodes were negative.  -Anatomy: Exophytic lesion arising from the posterolateral aspects of the right kidney. Left kidney is WNL.  -Personal/family history of GU malignancies:  -Smoking history: Previous half pack per day smoker. Quit 30 years ago  -Prior abdominal surgeries: Lap cholecystectomy  -Renal function: BMP from 03/29/2023 showed serum creatinine of 0.73 with an EGFR greater than 60  -History of kidney stones: denies   06/19/2023: 62 year old female who presents today for preoperative appointment. She is scheduled to undergo a partial right-sided nephrectomy on 06/29/2023. She denies changes to her medications and allergies. She denies recent fevers or chills. Chest pain and shortness of breath.     ALLERGIES: Codeine - Nausea    MEDICATIONS: Amlodipine Besilate  Tamoxifen Citrate     GU PSH: No GU PSH    NON-GU PSH: Breast lumpectomy Breast mastectomy Cholecystectomy  (laparoscopic) Tonsillectomy     GU PMH: Right renal neoplasm - 04/30/2023    NON-GU PMH: Breast Cancer, History - 04/30/2023 Hypercholesterolemia Hypertension    FAMILY HISTORY: 2 daughters - Daughter Cancer - Runs in Family Kidney Stones - Runs in Family Prostate Cancer - Brother Thyroid Disease - Runs in Family   SOCIAL HISTORY: Marital Status: Divorced Preferred Language: English; Ethnicity: Not Hispanic Or Latino; Race: White Current Smoking Status: Patient does not smoke anymore.   Tobacco Use Assessment Completed: Used Tobacco in last 30 days? Has never drank.  Drinks 2 caffeinated drinks per day.    REVIEW OF SYSTEMS:    GU Review Female:   Patient denies frequent urination, hard to postpone urination, burning /pain with urination, get up at night to urinate, leakage of urine, stream starts and stops, trouble starting your stream, have to strain to urinate, and being pregnant.  Gastrointestinal (Upper):   Patient denies nausea, vomiting, and indigestion/ heartburn.  Gastrointestinal (Lower):   Patient denies diarrhea and constipation.  Constitutional:   Patient denies fever, night sweats, weight loss, and fatigue.  Skin:   Patient denies skin rash/ lesion and itching.  Eyes:   Patient denies blurred vision and double vision.  Ears/ Nose/ Throat:   Patient denies sore throat and sinus problems.  Hematologic/Lymphatic:   Patient denies swollen glands and easy bruising.  Cardiovascular:   Patient denies leg swelling and chest pains.  Respiratory:   Patient denies cough and shortness of breath.  Endocrine:   Patient denies excessive thirst.  Musculoskeletal:   Patient denies back pain and joint pain.  Neurological:   Patient denies headaches and dizziness.  Psychologic:   Patient denies depression and  anxiety.   Notes: Pt stated there is a spot on her kidneys    VITAL SIGNS:      06/19/2023 03:37 PM  Weight 166 lb / 75.3 kg  Height 61 in / 154.94 cm  BP 132/81 mmHg   Pulse 80 /min  Temperature 97.3 F / 36.2 C  BMI 31.4 kg/m   MULTI-SYSTEM PHYSICAL EXAMINATION:    Constitutional: Well-nourished. No physical deformities. Normally developed. Good grooming.  Neck: Neck symmetrical, not swollen. Normal tracheal position.  Respiratory: Normal breath sounds. No labored breathing, no use of accessory muscles.   Cardiovascular: Regular rate and rhythm. No murmur, no gallop. Normal temperature, normal extremity pulses, no swelling, no varicosities.   Lymphatic: No enlargement of neck, axillae, groin.  Skin: No paleness, no jaundice, no cyanosis. No lesion, no ulcer, no rash.  Neurologic / Psychiatric: Oriented to time, oriented to place, oriented to person. No depression, no anxiety, no agitation.  Gastrointestinal: No mass, no tenderness, no rigidity, non obese abdomen.  Eyes: Normal conjunctivae. Normal eyelids.  Ears, Nose, Mouth, and Throat: Left ear no scars, no lesions, no masses. Right ear no scars, no lesions, no masses. Nose no scars, no lesions, no masses. Normal hearing. Normal lips.  Musculoskeletal: Normal gait and station of head and neck.     Complexity of Data:  Source Of History:  Patient  Records Review:   Previous Doctor Records, Previous Patient Records  Urine Test Review:   Urinalysis   06/19/23  Urinalysis  Urine Appearance Clear   Urine Color Yellow   Urine Glucose Neg mg/dL  Urine Bilirubin Neg mg/dL  Urine Ketones Neg mg/dL  Urine Specific Gravity 1.015   Urine Blood Neg ery/uL  Urine pH 6.5   Urine Protein Neg mg/dL  Urine Urobilinogen 0.2 mg/dL  Urine Nitrites Neg   Urine Leukocyte Esterase Neg leu/uL   PROCEDURES:          Urinalysis Dipstick Dipstick Cont'd  Color: Yellow Bilirubin: Neg mg/dL  Appearance: Clear Ketones: Neg mg/dL  Specific Gravity: 5.366 Blood: Neg ery/uL  pH: 6.5 Protein: Neg mg/dL  Glucose: Neg mg/dL Urobilinogen: 0.2 mg/dL    Nitrites: Neg    Leukocyte Esterase: Neg leu/uL    ASSESSMENT:       ICD-10 Details  1 GU:   Right renal neoplasm - D49.511 Chronic, Stable   PLAN:           Orders Labs CULTURE, URINE          Schedule Return Visit/Planned Activity: Keep Scheduled Appointment          Document Letter(s):  Created for Patient: Clinical Summary         Notes:   Urine sent for precautionary culture due to upcoming surgery. All of her questions regarding partial right nephrectomy were answered to the best my ability. She will keep her upcoming surgery as scheduled on 06/29/2023 with Dr. Liliane Shi. She understands to notify the office if she has any concerns or questions.    -I personally reviewed imaging results and films with the patient. We discussed that the mass in question has features concerning for malignancy. I explained the natural history of presumed renal cell carcinoma. I reviewed the AUA guidelines for evaluation and treatment of the small renal mass. The options of active surveillance, in situ tumor ablation, partial and radical nephrectomy was discussed. The risks of robot-assisted RIGHT partial nephrectomy were discussed in detail including but not limited to: negative pathology, open conversion, completion nephrectomy,  infection of the urinary tract/skin/abdominal cavity, VTE, MI/CVA, lymphatic leak, injury to adjacent solid/hollow viscus organs, bleeding requiring a blood transfusion, catastrophic bleeding, hernia formation, need for postoperative angioembolization, urinary leak requiring stent/drain, and other imponderables.

## 2023-06-29 ENCOUNTER — Ambulatory Visit (HOSPITAL_COMMUNITY): Payer: BC Managed Care – PPO | Admitting: Medical

## 2023-06-29 ENCOUNTER — Observation Stay (HOSPITAL_COMMUNITY)
Admission: RE | Admit: 2023-06-29 | Discharge: 2023-07-01 | Disposition: A | Discharge status: Home / Self Care | Payer: BC Managed Care – PPO | Source: Ambulatory Visit | Attending: Urology | Admitting: Urology

## 2023-06-29 ENCOUNTER — Ambulatory Visit (HOSPITAL_COMMUNITY): Payer: BC Managed Care – PPO | Admitting: Anesthesiology

## 2023-06-29 ENCOUNTER — Encounter (HOSPITAL_COMMUNITY): Payer: Self-pay | Admitting: Urology

## 2023-06-29 ENCOUNTER — Other Ambulatory Visit: Payer: Self-pay

## 2023-06-29 ENCOUNTER — Encounter (HOSPITAL_COMMUNITY)
Admission: RE | Disposition: A | Discharge status: Home / Self Care | Payer: Self-pay | Source: Ambulatory Visit | Attending: Urology

## 2023-06-29 DIAGNOSIS — N2889 Other specified disorders of kidney and ureter: Principal | ICD-10-CM | POA: Diagnosis present

## 2023-06-29 DIAGNOSIS — C641 Malignant neoplasm of right kidney, except renal pelvis: Secondary | ICD-10-CM | POA: Diagnosis present

## 2023-06-29 DIAGNOSIS — Z79899 Other long term (current) drug therapy: Secondary | ICD-10-CM | POA: Insufficient documentation

## 2023-06-29 DIAGNOSIS — Z01818 Encounter for other preprocedural examination: Secondary | ICD-10-CM

## 2023-06-29 DIAGNOSIS — I10 Essential (primary) hypertension: Secondary | ICD-10-CM | POA: Diagnosis not present

## 2023-06-29 DIAGNOSIS — Z853 Personal history of malignant neoplasm of breast: Secondary | ICD-10-CM | POA: Insufficient documentation

## 2023-06-29 HISTORY — PX: ROBOTIC ASSITED PARTIAL NEPHRECTOMY: SHX6087

## 2023-06-29 LAB — HEMOGLOBIN AND HEMATOCRIT, BLOOD
HCT: 40.7 % (ref 36.0–46.0)
Hemoglobin: 13.3 g/dL (ref 12.0–15.0)

## 2023-06-29 LAB — TYPE AND SCREEN
ABO/RH(D): O POS
Antibody Screen: NEGATIVE

## 2023-06-29 LAB — ABO/RH: ABO/RH(D): O POS

## 2023-06-29 SURGERY — NEPHRECTOMY, PARTIAL, ROBOT-ASSISTED
Anesthesia: General | Laterality: Right

## 2023-06-29 MED ORDER — FENTANYL CITRATE PF 50 MCG/ML IJ SOSY: 25.0000 ug | PREFILLED_SYRINGE | INTRAMUSCULAR | Status: DC | PRN

## 2023-06-29 MED ORDER — FENTANYL CITRATE (PF) 100 MCG/2ML IJ SOLN
INTRAMUSCULAR | Status: DC | PRN
  Administered 2023-06-29 (×2): 50 ug via INTRAVENOUS
  Administered 2023-06-29 (×2): 100 ug via INTRAVENOUS
  Administered 2023-06-29 (×2): 25 ug via INTRAVENOUS

## 2023-06-29 MED ORDER — FENTANYL CITRATE (PF) 250 MCG/5ML IJ SOLN
INTRAMUSCULAR | Status: AC
  Filled 2023-06-29: qty 5

## 2023-06-29 MED ORDER — ORAL CARE MOUTH RINSE: 15.0000 mL | Freq: Once | OROMUCOSAL | Status: AC

## 2023-06-29 MED ORDER — KETAMINE HCL 50 MG/5ML IJ SOSY
PREFILLED_SYRINGE | INTRAMUSCULAR | Status: AC
  Filled 2023-06-29: qty 5

## 2023-06-29 MED ORDER — HYOSCYAMINE SULFATE 0.125 MG SL SUBL: 0.1250 mg | SUBLINGUAL_TABLET | SUBLINGUAL | Status: DC | PRN

## 2023-06-29 MED ORDER — MIDAZOLAM HCL 2 MG/2ML IJ SOLN
INTRAMUSCULAR | Status: AC
  Filled 2023-06-29: qty 2

## 2023-06-29 MED ORDER — DEXAMETHASONE SODIUM PHOSPHATE 4 MG/ML IJ SOLN
INTRAMUSCULAR | Status: DC | PRN
  Administered 2023-06-29: 4 mg via INTRAVENOUS

## 2023-06-29 MED ORDER — ACETAMINOPHEN 160 MG/5ML PO SOLN: 1000.0000 mg | Freq: Once | ORAL | Status: DC | PRN

## 2023-06-29 MED ORDER — OXYCODONE HCL 5 MG/5ML PO SOLN: 5.0000 mg | Freq: Once | ORAL | Status: DC | PRN

## 2023-06-29 MED ORDER — SUGAMMADEX SODIUM 200 MG/2ML IV SOLN
INTRAVENOUS | Status: DC | PRN
  Administered 2023-06-29: 200 mg via INTRAVENOUS

## 2023-06-29 MED ORDER — SODIUM CHLORIDE (PF) 0.9 % IJ SOLN
INTRAMUSCULAR | Status: AC
  Filled 2023-06-29: qty 20

## 2023-06-29 MED ORDER — CHLORHEXIDINE GLUCONATE 0.12 % MT SOLN
15.0000 mL | Freq: Once | OROMUCOSAL | Status: AC
  Administered 2023-06-29: 15 mL via OROMUCOSAL

## 2023-06-29 MED ORDER — PROPOFOL 10 MG/ML IV BOLUS
INTRAVENOUS | Status: AC
  Filled 2023-06-29: qty 20

## 2023-06-29 MED ORDER — ONDANSETRON HCL 4 MG/2ML IJ SOLN
INTRAMUSCULAR | Status: DC | PRN
  Administered 2023-06-29: 4 mg via INTRAVENOUS

## 2023-06-29 MED ORDER — DOCUSATE SODIUM 100 MG PO CAPS: 100.0000 mg | ORAL_CAPSULE | Freq: Two times a day (BID) | ORAL | Status: AC

## 2023-06-29 MED ORDER — OXYCODONE HCL 5 MG PO TABS: 5.0000 mg | ORAL_TABLET | Freq: Once | ORAL | Status: DC | PRN

## 2023-06-29 MED ORDER — DIPHENHYDRAMINE HCL 50 MG/ML IJ SOLN: 12.5000 mg | Freq: Four times a day (QID) | INTRAMUSCULAR | Status: DC | PRN

## 2023-06-29 MED ORDER — ROCURONIUM BROMIDE 10 MG/ML (PF) SYRINGE
PREFILLED_SYRINGE | INTRAVENOUS | Status: AC
  Filled 2023-06-29: qty 10

## 2023-06-29 MED ORDER — ROCURONIUM BROMIDE 100 MG/10ML IV SOLN
INTRAVENOUS | Status: DC | PRN
  Administered 2023-06-29: 60 mg via INTRAVENOUS
  Administered 2023-06-29 (×3): 20 mg via INTRAVENOUS

## 2023-06-29 MED ORDER — HYDROCODONE-ACETAMINOPHEN 5-325 MG PO TABS: 1.0000 | ORAL_TABLET | Freq: Four times a day (QID) | ORAL | 0 refills | Status: DC | PRN

## 2023-06-29 MED ORDER — HYDROMORPHONE HCL 1 MG/ML IJ SOLN
0.5000 mg | INTRAMUSCULAR | Status: DC | PRN
  Administered 2023-06-29: 1 mg via INTRAVENOUS
  Filled 2023-06-29: qty 1

## 2023-06-29 MED ORDER — DEXAMETHASONE SODIUM PHOSPHATE 10 MG/ML IJ SOLN
INTRAMUSCULAR | Status: AC
  Filled 2023-06-29: qty 1

## 2023-06-29 MED ORDER — ONDANSETRON HCL 4 MG/2ML IJ SOLN
4.0000 mg | INTRAMUSCULAR | Status: DC | PRN
  Administered 2023-06-29 – 2023-06-30 (×2): 4 mg via INTRAVENOUS
  Filled 2023-06-29 (×2): qty 2

## 2023-06-29 MED ORDER — OXYCODONE HCL 5 MG PO TABS
ORAL_TABLET | ORAL | Status: AC
  Administered 2023-06-29: 5 mg via ORAL
  Filled 2023-06-29: qty 1

## 2023-06-29 MED ORDER — CEFAZOLIN SODIUM-DEXTROSE 2-4 GM/100ML-% IV SOLN
2.0000 g | INTRAVENOUS | Status: AC
  Administered 2023-06-29: 2 g via INTRAVENOUS
  Filled 2023-06-29: qty 100

## 2023-06-29 MED ORDER — SODIUM CHLORIDE (PF) 0.9 % IJ SOLN
INTRAMUSCULAR | Status: DC | PRN
  Administered 2023-06-29: 20 mL

## 2023-06-29 MED ORDER — ONDANSETRON HCL 4 MG/2ML IJ SOLN
INTRAMUSCULAR | Status: AC
  Filled 2023-06-29: qty 2

## 2023-06-29 MED ORDER — PHENYLEPHRINE 80 MCG/ML (10ML) SYRINGE FOR IV PUSH (FOR BLOOD PRESSURE SUPPORT)
PREFILLED_SYRINGE | INTRAVENOUS | Status: AC
  Filled 2023-06-29: qty 10

## 2023-06-29 MED ORDER — DOCUSATE SODIUM 100 MG PO CAPS
100.0000 mg | ORAL_CAPSULE | Freq: Two times a day (BID) | ORAL | Status: DC
  Administered 2023-06-29 – 2023-07-01 (×4): 100 mg via ORAL
  Filled 2023-06-29 (×4): qty 1

## 2023-06-29 MED ORDER — DIPHENHYDRAMINE HCL 12.5 MG/5ML PO ELIX: 12.5000 mg | ORAL_SOLUTION | Freq: Four times a day (QID) | ORAL | Status: DC | PRN

## 2023-06-29 MED ORDER — BUPIVACAINE LIPOSOME 1.3 % IJ SUSP
INTRAMUSCULAR | Status: AC
  Filled 2023-06-29: qty 20

## 2023-06-29 MED ORDER — SCOPOLAMINE 1 MG/3DAYS TD PT72
MEDICATED_PATCH | TRANSDERMAL | Status: AC
  Filled 2023-06-29: qty 1

## 2023-06-29 MED ORDER — MIDAZOLAM HCL 5 MG/5ML IJ SOLN
INTRAMUSCULAR | Status: DC | PRN
  Administered 2023-06-29: 2 mg via INTRAVENOUS

## 2023-06-29 MED ORDER — SODIUM CHLORIDE 0.9 % IV SOLN
INTRAVENOUS | Status: DC | PRN
Start: 2023-06-29 — End: 2023-06-29

## 2023-06-29 MED ORDER — SCOPOLAMINE 1 MG/3DAYS TD PT72
MEDICATED_PATCH | TRANSDERMAL | Status: DC | PRN
  Administered 2023-06-29: 1 via TRANSDERMAL

## 2023-06-29 MED ORDER — PROPOFOL 500 MG/50ML IV EMUL
INTRAVENOUS | Status: DC | PRN
Start: 2023-06-29 — End: 2023-06-29
  Administered 2023-06-29: 50 ug/kg/min via INTRAVENOUS
  Administered 2023-06-29: 150 mg via INTRAVENOUS

## 2023-06-29 MED ORDER — ACETAMINOPHEN 10 MG/ML IV SOLN: 1000.0000 mg | Freq: Once | INTRAVENOUS | Status: DC | PRN

## 2023-06-29 MED ORDER — FENTANYL CITRATE (PF) 100 MCG/2ML IJ SOLN
INTRAMUSCULAR | Status: AC
  Filled 2023-06-29: qty 2

## 2023-06-29 MED ORDER — SODIUM CHLORIDE 0.45 % IV SOLN: INTRAVENOUS | Status: DC

## 2023-06-29 MED ORDER — STERILE WATER FOR IRRIGATION IR SOLN
Status: DC | PRN
Start: 2023-06-29 — End: 2023-06-29
  Administered 2023-06-29: 1000 mL

## 2023-06-29 MED ORDER — ACETAMINOPHEN 10 MG/ML IV SOLN
1000.0000 mg | Freq: Four times a day (QID) | INTRAVENOUS | Status: AC
  Administered 2023-06-29 – 2023-06-30 (×4): 1000 mg via INTRAVENOUS
  Filled 2023-06-29 (×4): qty 100

## 2023-06-29 MED ORDER — BUPIVACAINE LIPOSOME 1.3 % IJ SUSP
INTRAMUSCULAR | Status: DC | PRN
  Administered 2023-06-29: 20 mL

## 2023-06-29 MED ORDER — AMLODIPINE BESYLATE 10 MG PO TABS
5.0000 mg | ORAL_TABLET | Freq: Every day | ORAL | Status: DC
  Administered 2023-06-29 – 2023-07-01 (×3): 5 mg via ORAL
  Filled 2023-06-29 (×3): qty 1

## 2023-06-29 MED ORDER — OXYCODONE HCL 5 MG PO TABS
5.0000 mg | ORAL_TABLET | ORAL | Status: DC | PRN
  Administered 2023-06-30: 5 mg via ORAL
  Filled 2023-06-29: qty 1

## 2023-06-29 MED ORDER — PHENYLEPHRINE HCL (PRESSORS) 10 MG/ML IV SOLN
INTRAVENOUS | Status: DC | PRN
Start: 2023-06-29 — End: 2023-06-29
  Administered 2023-06-29 (×2): 80 ug via INTRAVENOUS

## 2023-06-29 MED ORDER — ACETAMINOPHEN 500 MG PO TABS: 1000.0000 mg | ORAL_TABLET | Freq: Once | ORAL | Status: DC | PRN

## 2023-06-29 MED ORDER — LACTATED RINGERS IV SOLN: INTRAVENOUS | Status: DC

## 2023-06-29 MED ORDER — LACTATED RINGERS IR SOLN
Status: DC | PRN
  Administered 2023-06-29: 1000 mL

## 2023-06-29 MED ORDER — TAMOXIFEN CITRATE 10 MG PO TABS
20.0000 mg | ORAL_TABLET | Freq: Every day | ORAL | Status: DC
  Filled 2023-06-29 (×3): qty 2

## 2023-06-29 MED ORDER — "VISTASEAL 4 ML SINGLE DOSE KIT "
4.0000 mL | PACK | Freq: Once | CUTANEOUS | Status: AC
  Administered 2023-06-29: 4 mL via TOPICAL
  Filled 2023-06-29: qty 4

## 2023-06-29 MED ORDER — KETAMINE HCL 10 MG/ML IJ SOLN
INTRAMUSCULAR | Status: DC | PRN
  Administered 2023-06-29 (×5): 10 mg via INTRAVENOUS

## 2023-06-29 SURGICAL SUPPLY — 79 items
ADH SKN CLS APL DERMABOND .7 (GAUZE/BANDAGES/DRESSINGS) ×1
AGENT HMST KT MTR STRL THRMB (HEMOSTASIS)
APL ESCP 34 STRL LF DISP (HEMOSTASIS)
APL LAPSCP 35 DL APL RGD (MISCELLANEOUS) ×1
APL PRP STRL LF DISP 70% ISPRP (MISCELLANEOUS) ×1
APPLICATOR SURGIFLO ENDO (HEMOSTASIS) IMPLANT
APPLICATOR VISTASEAL 35 (MISCELLANEOUS) ×2 IMPLANT
BAG COUNTER SPONGE SURGICOUNT (BAG) IMPLANT
BAG SPNG CNTER NS LX DISP (BAG)
CAUTERY HOOK MNPLR 1.6 DVNC XI (INSTRUMENTS) ×2 IMPLANT
CHLORAPREP W/TINT 26 (MISCELLANEOUS) ×2 IMPLANT
CLIP LIGATING HEM O LOK PURPLE (MISCELLANEOUS) ×4 IMPLANT
CLIP LIGATING HEMO LOK XL GOLD (MISCELLANEOUS) IMPLANT
CLIP LIGATING HEMO O LOK GREEN (MISCELLANEOUS) IMPLANT
CLIP SUT LAPRA TY ABSORB (SUTURE) ×4 IMPLANT
COVER SURGICAL LIGHT HANDLE (MISCELLANEOUS) ×2 IMPLANT
COVER TIP SHEARS 8 DVNC (MISCELLANEOUS) ×2 IMPLANT
DERMABOND ADVANCED .7 DNX12 (GAUZE/BANDAGES/DRESSINGS) ×2 IMPLANT
DRAIN CHANNEL 15F RND FF 3/16 (WOUND CARE) IMPLANT
DRAIN RELI 100 BL SUC LF ST (DRAIN)
DRAPE ARM DVNC X/XI (DISPOSABLE) ×8 IMPLANT
DRAPE COLUMN DVNC XI (DISPOSABLE) ×2 IMPLANT
DRAPE INCISE IOBAN 66X45 STRL (DRAPES) ×2 IMPLANT
DRAPE SHEET LG 3/4 BI-LAMINATE (DRAPES) ×2 IMPLANT
DRIVER NDL LRG 8 DVNC XI (INSTRUMENTS) ×6 IMPLANT
DRIVER NDLE LRG 8 DVNC XI (INSTRUMENTS) ×2
ELECT PENCIL ROCKER SW 15FT (MISCELLANEOUS) ×2 IMPLANT
ELECT REM PT RETURN 15FT ADLT (MISCELLANEOUS) ×2 IMPLANT
EVACUATOR SILICONE 100CC (DRAIN) IMPLANT
FORCEPS PROGRASP DVNC XI (FORCEP) ×4 IMPLANT
GAUZE 4X4 16PLY ~~LOC~~+RFID DBL (SPONGE) IMPLANT
GLOVE BIO SURGEON STRL SZ 6.5 (GLOVE) ×2 IMPLANT
GLOVE BIOGEL PI IND STRL 8 (GLOVE) ×2 IMPLANT
GLOVE SURG LX STRL 7.5 STRW (GLOVE) ×4 IMPLANT
GOWN STRL REUS W/ TWL XL LVL3 (GOWN DISPOSABLE) ×4 IMPLANT
GOWN STRL REUS W/TWL XL LVL3 (GOWN DISPOSABLE) ×2
GOWN STRL SURGICAL XL XLNG (GOWN DISPOSABLE) ×2 IMPLANT
HEMOSTAT SURGICEL 4X8 (HEMOSTASIS) ×2 IMPLANT
HOLDER FOLEY CATH W/STRAP (MISCELLANEOUS) ×2 IMPLANT
IRRIG SUCT STRYKERFLOW 2 WTIP (MISCELLANEOUS) ×1
IRRIGATION SUCT STRKRFLW 2 WTP (MISCELLANEOUS) ×2 IMPLANT
KIT BASIN OR (CUSTOM PROCEDURE TRAY) ×2 IMPLANT
KIT TURNOVER KIT A (KITS) IMPLANT
LOOP VESSEL MAXI BLUE (MISCELLANEOUS) IMPLANT
MARKER SKIN DUAL TIP RULER LAB (MISCELLANEOUS) ×2 IMPLANT
NDL INSUFFLATION 14GA 120MM (NEEDLE) ×2 IMPLANT
NEEDLE INSUFFLATION 14GA 120MM (NEEDLE) ×1
PROTECTOR NERVE ULNAR (MISCELLANEOUS) ×4 IMPLANT
RELOAD STAPLE 45 2.6 WHT THIN (STAPLE) IMPLANT
SCISSORS LAP 5X45 EPIX DISP (ENDOMECHANICALS) ×2 IMPLANT
SCISSORS MNPLR CVD DVNC XI (INSTRUMENTS) ×2 IMPLANT
SEAL UNIV 5-12 XI (MISCELLANEOUS) ×8 IMPLANT
SET TUBE SMOKE EVAC HIGH FLOW (TUBING) ×2 IMPLANT
SOL ELECTROSURG ANTI STICK (MISCELLANEOUS) ×1
SOLUTION ELECTROSURG ANTI STCK (MISCELLANEOUS) ×2 IMPLANT
SPIKE FLUID TRANSFER (MISCELLANEOUS) ×2 IMPLANT
STAPLE RELOAD 45 WHT (STAPLE)
STAPLER POWER ECHELON 45 WIDE (STAPLE) IMPLANT
SURGIFLO W/THROMBIN 8M KIT (HEMOSTASIS) IMPLANT
SUT ETHILON 2 0 PS N (SUTURE) IMPLANT
SUT MNCRL AB 4-0 PS2 18 (SUTURE) ×4 IMPLANT
SUT PDS AB 0 CT1 36 (SUTURE) IMPLANT
SUT V-LOC BARB 180 2/0GR6 GS22 (SUTURE)
SUT VIC AB 1 CT1 36 (SUTURE) ×8 IMPLANT
SUT VIC AB 2-0 SH 27 (SUTURE) ×1
SUT VIC AB 2-0 SH 27X BRD (SUTURE) IMPLANT
SUT VICRYL 0 UR6 27IN ABS (SUTURE) IMPLANT
SUT VLOC 3-0 9IN GRN (SUTURE) ×2 IMPLANT
SUT VLOC BARB 180 ABS3/0GR12 (SUTURE) ×2
SUTURE V-LC BRB 180 2/0GR6GS22 (SUTURE) IMPLANT
SUTURE VLOC BRB 180 ABS3/0GR12 (SUTURE) ×2 IMPLANT
SYS BAG RETRIEVAL 10MM (BASKET) ×1
SYSTEM BAG RETRIEVAL 10MM (BASKET) ×2 IMPLANT
TOWEL OR 17X26 10 PK STRL BLUE (TOWEL DISPOSABLE) ×2 IMPLANT
TRAY FOLEY MTR SLVR 16FR STAT (SET/KITS/TRAYS/PACK) ×2 IMPLANT
TRAY LAPAROSCOPIC (CUSTOM PROCEDURE TRAY) ×2 IMPLANT
TROCAR Z THREAD OPTICAL 12X100 (TROCAR) ×2 IMPLANT
TROCAR Z-THREAD OPTICAL 5X100M (TROCAR) IMPLANT
WATER STERILE IRR 1000ML POUR (IV SOLUTION) ×2 IMPLANT

## 2023-06-29 NOTE — Op Note (Signed)
Operative Note  Preoperative diagnosis:  1.  2.2 cm right renal cell carcinoma  Postoperative diagnosis: 1.  2.2 cm right renal cell carcinoma  Procedure(s): 1.  Robot-assisted laparoscopic right partial nephrectomy 2.  Intraoperative ultrasound of single retroperitoneal organ  Surgeon: Rhoderick Moody, MD  Assistants: 1.  Harrie Foreman, PA-C An assistant was required for this surgical procedure.  The duties of the assistant included but were not limited to suctioning, passing suture, camera manipulation, retraction.  This procedure would not be able to be performed without an Geophysicist/field seismologist.  2.  Cathren Harsh, MD PGY 4  Anesthesia:  General  Complications:  None  EBL: 100 mL  Specimens: 1.  Right renal mass  Drains/Catheters: 1.  Foley catheter  Intraoperative findings:   Grossly negative margins following excision of right renal mass Right renorrhaphy was hemostatic at the conclusion of the case  Indication:  Alohilani Yaldo is a 62 y.o. female with a 2.2 cm, biopsy confirmed, right renal cell carcinoma.  She has been consented for the above procedures, voices understanding and wishes to proceed.  Description of procedure:  After informed consent was signed, the patient was taken back to the operating room and properly anesthetized.  The patient was then placed in the left lateral decubitus position with all pressure points padded.  The abdomen was then prepped and draped in the usual sterile fashion.  A time-out was then performed.    An 8 mm incision was then made lateral to the right rectus muscle at the level of the right 12th rib.  A Veress needle was then used to access the abdominal cavity.  A saline drop test showed no signs of obstruction and aspiration of the Veress needle revealed no blood or sucus.  The abdominal cavity was then insufflated to 15 mmHg.  An 8 mm robotic trocar was then atraumatically inserted into the abdominal cavity.  The robotic camera was then inserted  through the port and inspection of the abdominal cavity revealed no evidence of adjacent organ or vessel injury.  We then placed three additional 8 mm robotic ports and a 12 mm assistant port in such as fashion as to triangulate the right renal hilum.  The robot was then docked into postion.   Using a combination of blunt and cold scissors dissection, the hepatic attachments were released from the abdominal sidewall.  The white line of Toldt along the ascending colon was then incised, allowing [place] to reflect the colon medially and expose the anterior surface of the right kidney.  The duodenum was then Kocherized medially, which abruptly led [place] to identification of the inferior vena cava.    Once the colon was adequately mobilized, we moved to the lower pole and identified the gonadal vein and ureter.  The gonadal vein was then left running parallel to the vena cava and the right ureter was reflected anteriorly.  Using cautious cautery, the overlying perihilar attachments were then released.  This yielded visualization of the renal hilum, which included a single right renal vein and a single right renal artery.  The perilymphatic tissue surrounding the right renal artery were carefully released so that the right renal artery was fully encircled.    We next turned our attention to defatting the kidney.  An anterior incision along Gerota's fascia was created and the kidney was fully mobilized.   The renal mass is identified at the anterolateral mid pole.  Using intraoperative ultrasound, the tumor was then carefully evaluated and demonstrated heterogenous echogenicity  compared to the rest of the renal parenchyma. The resection margin was marked to allow for wide excision of the renal mass.  The renal hilum was once again identified and a bulldog clamp was placed.    Using cold scissors, the right renal mass was then carefully excised, leaving a grossly negative margin.  Excision of the mass appeared complete  with no tumor grossly remaining.  The tumor was then placed in the right lower quadrant, to be retrieved following repair of the renal defect.  A running 3-0 V lock suture was then used to reapproximate the resection bed.  Tension was placed with hemo-lock clips.   The right renal capsule was then reapproximated using a 0 Vicryl suture on a CT-1 needle in an interrupted fashion using hemo-lock clips as a buttress.  Lapra-Ty's were then placed on each of the interrupted sutures.  The bulldog was then released, which warm ischemia time totaled 18 minutes.   Once hemostasis was achieved and the renal bed was irrigated, Surgicel and Vistaseal was then placed over the renorrhaphy.  Gerota's fascia was then reapproximated with a running v-lok suture and the mesocolonic fat along the descending colon was then reapproximated to the right abdominal sidewall using Hem-o-lok clips.  The renal mass was retrieved and placed in an Endo Catch bag.  The mass was extracted through the 12 mm assistant port.  The fascia of the assistant port was then reapproximated with a 0 Vicryl suture.    The abdomen was desufflated with all ports removed.  The skin was then reapproximated using running Monocryl and dressed appropriately.  The patient was then replaced in the supine position and was awakened from anesthesia without complications.  Plan: Monitor on the floor overnight

## 2023-06-29 NOTE — Anesthesia Procedure Notes (Signed)
Procedure Name: Intubation Date/Time: 06/29/2023 12:29 PM  Performed by: Ahmed Prima, CRNAPre-anesthesia Checklist: Patient identified, Emergency Drugs available, Suction available and Patient being monitored Patient Re-evaluated:Patient Re-evaluated prior to induction Oxygen Delivery Method: Circle system utilized Preoxygenation: Pre-oxygenation with 100% oxygen Induction Type: IV induction Ventilation: Mask ventilation without difficulty and Oral airway inserted - appropriate to patient size Laryngoscope Size: Hyacinth Meeker and 2 Grade View: Grade II Tube type: Oral Tube size: 7.0 mm Number of attempts: 1 Airway Equipment and Method: Stylet and Oral airway Placement Confirmation: ETT inserted through vocal cords under direct vision, positive ETCO2 and breath sounds checked- equal and bilateral Secured at: 22 cm Tube secured with: Tape Dental Injury: Teeth and Oropharynx as per pre-operative assessment

## 2023-06-29 NOTE — Anesthesia Preprocedure Evaluation (Signed)
Anesthesia Evaluation  Patient identified by MRN, date of birth, ID band Patient awake    Reviewed: Allergy & Precautions, NPO status , Patient's Chart, lab work & pertinent test results  History of Anesthesia Complications Negative for: history of anesthetic complications  Airway Mallampati: II  TM Distance: >3 FB Neck ROM: Full    Dental  (+) Dental Advisory Given, Teeth Intact   Pulmonary neg shortness of breath, neg sleep apnea, neg COPD, neg recent URI, former smoker   breath sounds clear to auscultation       Cardiovascular hypertension, Pt. on medications (-) angina (-) Past MI and (-) CHF  Rhythm:Regular     Neuro/Psych negative neurological ROS  negative psych ROS   GI/Hepatic negative GI ROS, Neg liver ROS,,,  Endo/Other  negative endocrine ROS    Renal/GU RIGHT RENAL CELL CARCINOMA     Musculoskeletal negative musculoskeletal ROS (+)    Abdominal   Peds  Hematology negative hematology ROS (+)   Anesthesia Other Findings   Reproductive/Obstetrics                             Anesthesia Physical Anesthesia Plan  ASA: 2  Anesthesia Plan: General   Post-op Pain Management: Ofirmev IV (intra-op)* and Ketamine IV*   Induction: Intravenous  PONV Risk Score and Plan: 3 and Ondansetron, Dexamethasone, Propofol infusion and Scopolamine patch - Pre-op  Airway Management Planned: Oral ETT  Additional Equipment: None  Intra-op Plan:   Post-operative Plan: Extubation in OR  Informed Consent: I have reviewed the patients History and Physical, chart, labs and discussed the procedure including the risks, benefits and alternatives for the proposed anesthesia with the patient or authorized representative who has indicated his/her understanding and acceptance.     Dental advisory given  Plan Discussed with:   Anesthesia Plan Comments:        Anesthesia Quick Evaluation

## 2023-06-29 NOTE — Transfer of Care (Signed)
Immediate Anesthesia Transfer of Care Note  Patient: Norma Robles  Procedure(s) Performed: XI ROBOTIC ASSITED RIGHT PARTIAL NEPHRECTOMY (Right)  Patient Location: PACU  Anesthesia Type:General  Level of Consciousness: drowsy and responds to stimulation  Airway & Oxygen Therapy: Patient Spontanous Breathing and Patient connected to face mask oxygen  Post-op Assessment: Report given to RN and Post -op Vital signs reviewed and stable  Post vital signs: Reviewed and stable  Last Vitals:  Vitals Value Taken Time  BP 152/84 06/29/23 1527  Temp    Pulse 75 06/29/23 1528  Resp 12 06/29/23 1528  SpO2 97 % 06/29/23 1528  Vitals shown include unfiled device data.  Last Pain:  Vitals:   06/29/23 1113  TempSrc:   PainSc: 0-No pain      Patients Stated Pain Goal: 4 (06/29/23 1113)  Complications: No notable events documented.

## 2023-06-29 NOTE — Discharge Instructions (Signed)
Activity:  You are encouraged to ambulate frequently (about every hour during waking hours) to help prevent blood clots from forming in your legs or lungs.  However, you should not engage in any heavy lifting (> 10-15 lbs), strenuous activity, or straining. Diet: You should advance your diet as instructed by your physician.  It will be normal to have some bloating, nausea, and abdominal discomfort intermittently. Prescriptions:  You will be provided a prescription for pain medication to take as needed.  If your pain is not severe enough to require the prescription pain medication, you may take extra strength Tylenol instead which will have less side effects.  You should also take a prescribed stool softener to avoid straining with bowel movements as the prescription pain medication may constipate you. Incisions: You may remove your dressing bandages 48 hours after surgery if not removed in the hospital.  You will either have some small staples or special tissue glue at each of the incision sites. Once the bandages are removed (if present), the incisions may stay open to air.  You may start showering (but not soaking or bathing in water) the 2nd day after surgery and the incisions simply need to be patted dry after the shower.  No additional care is needed. What to call us about: You should call the office ([phone]) if you develop fever > 101 or develop persistent vomiting.   You may resume aspirin, advil, aleve, vitamins, and supplements 7 days after surgery. 

## 2023-06-30 ENCOUNTER — Other Ambulatory Visit: Payer: Self-pay

## 2023-06-30 DIAGNOSIS — C641 Malignant neoplasm of right kidney, except renal pelvis: Secondary | ICD-10-CM | POA: Diagnosis not present

## 2023-06-30 LAB — BASIC METABOLIC PANEL
Anion gap: 10 (ref 5–15)
BUN: 10 mg/dL (ref 8–23)
CO2: 21 mmol/L — ABNORMAL LOW (ref 22–32)
Calcium: 8 mg/dL — ABNORMAL LOW (ref 8.9–10.3)
Chloride: 104 mmol/L (ref 98–111)
Creatinine, Ser: 0.81 mg/dL (ref 0.44–1.00)
GFR, Estimated: >60 mL/min (ref >60)
Glucose, Bld: 120 mg/dL — ABNORMAL HIGH (ref 70–99)
Potassium: 3.6 mmol/L (ref 3.5–5.1)
Sodium: 135 mmol/L (ref 135–145)

## 2023-06-30 LAB — HEMOGLOBIN AND HEMATOCRIT, BLOOD
HCT: 36.3 % (ref 36.0–46.0)
Hemoglobin: 11.9 g/dL — ABNORMAL LOW (ref 12.0–15.0)

## 2023-06-30 MED ORDER — SODIUM CHLORIDE 0.9 % IV SOLN: INTRAVENOUS | Status: DC

## 2023-06-30 MED ORDER — ENOXAPARIN SODIUM 40 MG/0.4ML IJ SOSY
40.0000 mg | PREFILLED_SYRINGE | INTRAMUSCULAR | Status: DC
  Administered 2023-06-30: 40 mg via SUBCUTANEOUS
  Filled 2023-06-30: qty 0.4

## 2023-06-30 NOTE — Progress Notes (Signed)
Urology Inpatient Progress Report  Renal mass [N28.89]  Procedure(s): XI ROBOTIC ASSITED RIGHT PARTIAL NEPHRECTOMY  1 Day Post-Op   Intv/Subj: Patient without complaint this morning.  Had a mild amount of nausea when I saw her.  However, nursing reported large volume emesis, 300 mL after I saw her.  Principal Problem:   Renal mass  Current Facility-Administered Medications  Medication Dose Route Frequency Provider Last Rate Last Admin   0.9 %  sodium chloride infusion   Intravenous Continuous Ray Church III, MD       acetaminophen (OFIRMEV) IV 1,000 mg  1,000 mg Intravenous Q6H Harrie Foreman, PA-C 400 mL/hr at 06/30/23 0940 1,000 mg at 06/30/23 0940   amLODipine (NORVASC) tablet 5 mg  5 mg Oral Daily Harrie Foreman, PA-C   5 mg at 06/30/23 0945   diphenhydrAMINE (BENADRYL) injection 12.5-25 mg  12.5-25 mg Intravenous Q6H PRN Harrie Foreman, PA-C       Or   diphenhydrAMINE (BENADRYL) 12.5 MG/5ML elixir 12.5-25 mg  12.5-25 mg Oral Q6H PRN Harrie Foreman, PA-C       docusate sodium (COLACE) capsule 100 mg  100 mg Oral BID Harrie Foreman, PA-C   100 mg at 06/30/23 0936   enoxaparin (LOVENOX) injection 40 mg  40 mg Subcutaneous Q24H Ray Church III, MD       HYDROmorphone (DILAUDID) injection 0.5-1 mg  0.5-1 mg Intravenous Q2H PRN Harrie Foreman, PA-C   1 mg at 06/29/23 1733   hyoscyamine (LEVSIN SL) SL tablet 0.125 mg  0.125 mg Sublingual Q4H PRN Harrie Foreman, PA-C       ondansetron (ZOFRAN) injection 4 mg  4 mg Intravenous Q4H PRN Harrie Foreman, PA-C   4 mg at 06/29/23 1733   oxyCODONE (Oxy IR/ROXICODONE) immediate release tablet 5 mg  5 mg Oral Q4H PRN Harrie Foreman, PA-C   5 mg at 06/29/23 1650   tamoxifen (NOLVADEX) tablet 20 mg  20 mg Oral Daily Harrie Foreman, PA-C         Objective: Vital: Vitals:   06/29/23 1710 06/29/23 2029 06/30/23 0413 06/30/23 0945  BP: (!) 146/79 134/77 130/80 (!) 144/84  Pulse: 85 78 89   Resp: 18 16 18    Temp: (!) 97.3 F (36.3 C) (!) 97.5 F  (36.4 C) 98.6 F (37 C)   TempSrc: Oral Oral Oral   SpO2: 94% 97% 97%   Weight:      Height:       I/Os: I/O last 3 completed shifts: In: 2215 [P.O.:240; I.V.:1875; IV Piggyback:100] Out: 1305 [Urine:1205; Blood:100]  Physical Exam:  General: Patient is in no apparent distress Lungs: Normal respiratory effort, chest expands symmetrically. GI: Incisions are c/d/i. The abdomen is soft and nontender without mass. Foley: Draining clear yellow urine Ext: lower extremities symmetric  Lab Results: Recent Labs    06/29/23 1601 06/30/23 0446  HGB 13.3 11.9*  HCT 40.7 36.3   Recent Labs    06/30/23 0446  NA 135  K 3.6  CL 104  CO2 21*  GLUCOSE 120*  BUN 10  CREATININE 0.81  CALCIUM 8.0*   No results for input(s): "LABPT", "INR" in the last 72 hours. No results for input(s): "LABURIN" in the last 72 hours. No results found for this or any previous visit.  Studies/Results: No results found.  Assessment: Right renal mass  Procedure(s): XI ROBOTIC ASSITED RIGHT PARTIAL NEPHRECTOMY, 1 Day Post-Op  doing well.  Plan: Continue clear liquid diet until nausea improves Continue IV fluids  at 75 cc/h until good p.o. intake Discontinue Foley catheter Lovenox for DVT prophylaxis A.m. H&H Ambulate, incentive spirometry   Modena Slater, MD Urology 06/30/2023, 10:04 AM

## 2023-06-30 NOTE — Progress Notes (Signed)
At 1002: RN informed on call MD that patient had vomited 300 mL of yellow emesis after discharge orders had been placed. RN gave IV Zofran and MD told RN he wants to keep patient one more day. MD placed new orders.  At 1629: RN notified on call MD asking to see if patient could have a couple of crackers to see if that would help, even though patient is on a clear liquid diet. Patient has only been able to tolerate water, but no other clear liquids. Patient told RN that the taste is too strong. On call MD said it was okay for patient to try a couple of crackers.  At 1900: Patient told RN that she was able to tolerate and keep the crackers down.

## 2023-06-30 NOTE — Plan of Care (Signed)
  Problem: Education: Goal: Knowledge of the prescribed therapeutic regimen will improve Outcome: Progressing   Problem: Clinical Measurements: Goal: Postoperative complications will be avoided or minimized Outcome: Progressing   Problem: Respiratory: Goal: Ability to achieve and maintain a regular respiratory rate will improve Outcome: Progressing   Problem: Skin Integrity: Goal: Demonstration of wound healing without infection will improve Outcome: Progressing   Problem: Urinary Elimination: Goal: Ability to achieve and maintain urine output will improve Outcome: Progressing   Problem: Education: Goal: Knowledge of General Education information will improve Description: Including pain rating scale, medication(s)/side effects and non-pharmacologic comfort measures Outcome: Progressing   Problem: Activity: Goal: Risk for activity intolerance will decrease Outcome: Progressing   Problem: Coping: Goal: Level of anxiety will decrease Outcome: Progressing   Problem: Elimination: Goal: Will not experience complications related to urinary retention Outcome: Progressing   Problem: Pain Managment: Goal: General experience of comfort will improve Outcome: Progressing   Problem: Safety: Goal: Ability to remain free from injury will improve Outcome: Progressing   Problem: Skin Integrity: Goal: Risk for impaired skin integrity will decrease Outcome: Progressing

## 2023-06-30 NOTE — Plan of Care (Signed)
  Problem: Pain Managment: Goal: General experience of comfort will improve Outcome: Progressing   Problem: Safety: Goal: Ability to remain free from injury will improve Outcome: Progressing   Problem: Skin Integrity: Goal: Risk for impaired skin integrity will decrease Outcome: Progressing   

## 2023-07-01 DIAGNOSIS — C641 Malignant neoplasm of right kidney, except renal pelvis: Secondary | ICD-10-CM | POA: Diagnosis not present

## 2023-07-01 LAB — HEMOGLOBIN AND HEMATOCRIT, BLOOD
HCT: 34.6 % — ABNORMAL LOW (ref 36.0–46.0)
Hemoglobin: 11.1 g/dL — ABNORMAL LOW (ref 12.0–15.0)

## 2023-07-01 MED ORDER — PROMETHAZINE HCL 25 MG PO TABS: 25.0000 mg | ORAL_TABLET | Freq: Four times a day (QID) | ORAL | 0 refills | Status: DC | PRN

## 2023-07-01 NOTE — Progress Notes (Signed)
Pt c/o nausea & decreased appetite due to nause - min po intake. This RN followed up w/ primary RN to see if on call urology  could send in zofran to pharmacy onm record. Pt given an alcohol wipe to help with nausea. Pain medication scheduling reinforced w/ pt and daughter. Pt verbalized an understanding.

## 2023-07-01 NOTE — Anesthesia Postprocedure Evaluation (Signed)
Anesthesia Post Note  Patient: Norma Robles  Procedure(s) Performed: XI ROBOTIC ASSITED RIGHT PARTIAL NEPHRECTOMY (Right)     Patient location during evaluation: PACU Anesthesia Type: General Level of consciousness: awake and alert Pain management: pain level controlled Vital Signs Assessment: post-procedure vital signs reviewed and stable Respiratory status: spontaneous breathing, nonlabored ventilation, respiratory function stable and patient connected to nasal cannula oxygen Cardiovascular status: blood pressure returned to baseline and stable Postop Assessment: no apparent nausea or vomiting Anesthetic complications: no   No notable events documented.  Last Vitals:  Vitals:   06/30/23 2010 07/01/23 0523  BP: 131/72 133/78  Pulse: 87 (!) 103  Resp: 18 18  Temp: 36.9 C 36.9 C  SpO2: 96% 95%    Last Pain:  Vitals:   07/01/23 0523  TempSrc: Oral  PainSc:                  Norma Robles

## 2023-07-01 NOTE — Plan of Care (Signed)
  Problem: Education: Goal: Knowledge of the prescribed therapeutic regimen will improve Outcome: Completed/Met   Problem: Bowel/Gastric: Goal: Gastrointestinal status for postoperative course will improve Outcome: Completed/Met   Problem: Clinical Measurements: Goal: Postoperative complications will be avoided or minimized Outcome: Completed/Met   Problem: Respiratory: Goal: Ability to achieve and maintain a regular respiratory rate will improve Outcome: Completed/Met   Problem: Skin Integrity: Goal: Demonstration of wound healing without infection will improve Outcome: Completed/Met   Problem: Urinary Elimination: Goal: Ability to avoid or minimize complications of infection will improve Outcome: Completed/Met Goal: Ability to achieve and maintain urine output will improve Outcome: Completed/Met   Problem: Education: Goal: Knowledge of General Education information will improve Description: Including pain rating scale, medication(s)/side effects and non-pharmacologic comfort measures Outcome: Completed/Met   Problem: Health Behavior/Discharge Planning: Goal: Ability to manage health-related needs will improve Outcome: Completed/Met   Problem: Clinical Measurements: Goal: Ability to maintain clinical measurements within normal limits will improve Outcome: Completed/Met Goal: Will remain free from infection Outcome: Completed/Met Goal: Diagnostic test results will improve Outcome: Completed/Met Goal: Respiratory complications will improve Outcome: Completed/Met Goal: Cardiovascular complication will be avoided Outcome: Completed/Met   Problem: Activity: Goal: Risk for activity intolerance will decrease Outcome: Completed/Met   Problem: Nutrition: Goal: Adequate nutrition will be maintained Outcome: Completed/Met   Problem: Coping: Goal: Level of anxiety will decrease Outcome: Completed/Met   Problem: Elimination: Goal: Will not experience complications  related to bowel motility Outcome: Completed/Met Goal: Will not experience complications related to urinary retention Outcome: Completed/Met   Problem: Pain Managment: Goal: General experience of comfort will improve Outcome: Completed/Met   Problem: Safety: Goal: Ability to remain free from injury will improve Outcome: Completed/Met   Problem: Skin Integrity: Goal: Risk for impaired skin integrity will decrease Outcome: Completed/Met   

## 2023-07-01 NOTE — Progress Notes (Signed)
Phenergan sent in to pharmacy on record by Dr Berneice Heinrich - Medication and dosing schedule reviewed w/ Almira Coaster. This RN reinforced w/ Taneil that this recovery may take longer than her recovery from her mastectomy. Pt also c/o gas pains- pt reminded that walking would help alleviate gas pains and drinking warm tea sometimes helps. Pt to keep a notebook w/ questions and medication schedule. Pt verbalized an understanding - no other questions at this time

## 2023-07-01 NOTE — Progress Notes (Signed)
AVS reviewed w/ patient & daughter who verbalized  an understanding. PIV removed by primary RN. PT eating breakfast and then will dress & d/c to home via POV .

## 2023-07-01 NOTE — Discharge Summary (Signed)
Physician Discharge Summary  Patient ID: Norma Robles MRN: [id number] DOB/AGE: [birth year] 63 y.o.  Admit date: 06/29/2023 Discharge date: 07/01/2023  Admission Diagnoses: RIGHT Renal Mass  Discharge Diagnoses:  Principal Problem:   Renal mass   Discharged Condition: good  Hospital Course: Pt underwent RIGHT robotic partial nephrectomy on 06/29/23, the day of admission under care of Dr. Liliane Shi, without acute complication. Admitted to 4th floor Urology service post-op. Had small transient nausea with emesis POD 1, tolerated clears after. By the AM of 07/01/23, the day of discharge, she is ambulatory, pain managable on PO meds, tolerating diet, and felt to be adequate for discharge. Hrb 11, Cr 0.8, surgical path pending at discharge.   Consults: None  Significant Diagnostic Studies: labs: as per above  Treatments: surgery: as per above  Discharge Exam: Blood pressure 133/78, pulse (!) 103, temperature 98.5 F (36.9 C), temperature source Oral, resp. rate 18, height 4' 11.75" (1.518 m), weight 74.4 kg, last menstrual period 04/14/2015, SpO2 95%.  NAD, mild anxiety, pleasant, wants to go home Non-labored breathing on RA Mild, stable, ragular tachy low 100s Mild TTP over Rt lateral incision sites as expcected. No r/g. NO drainage / erythema No foley No c/c/e  Disposition: HOME   Allergies as of 07/01/2023       Reactions   Codeine Nausea Only        Medication List     STOP taking these medications    Advil Cold/Sinus 30-200 MG Tabs Generic drug: Pseudoephedrine-Ibuprofen   CALCIUM SOFT CHEWS PO   Vitamin D 50 MCG (2000 UT) Caps       TAKE these medications    amLODipine 5 MG tablet Commonly known as: NORVASC Take 5 mg by mouth daily.   docusate sodium 100 MG capsule Commonly known as: COLACE Take 1 capsule (100 mg total) by mouth 2 (two) times daily.   Dry Eye Relief Drops 0.2-0.2-1 % Soln Generic drug: Glycerin-Hypromellose-PEG 400 Place 1 drop into both  eyes daily as needed (Dry eye).   HYDROcodone-acetaminophen 5-325 MG tablet Commonly known as: Norco Take 1-2 tablets by mouth every 6 (six) hours as needed for moderate pain or severe pain.   tamoxifen 20 MG tablet Commonly known as: NOLVADEX Take 1 tablet (20 mg total) by mouth daily.        Follow-up Information     Rene Paci, MD Follow up on 07/13/2023.   Specialty: Urology Why: at 9:45 Contact information: [address] [place] [address] [place] [address] [phone]                 Signed: Loletta Parish. 07/01/2023, 8:49 AM

## 2023-07-01 NOTE — Plan of Care (Signed)
Monitor labs and vital signs. Encourage ambulation. Monitor pain and medicate as needed. Continue current plan of care.

## 2023-07-02 ENCOUNTER — Encounter (HOSPITAL_COMMUNITY): Payer: Self-pay | Admitting: Urology

## 2023-07-05 LAB — SURGICAL PATHOLOGY

## 2023-08-28 ENCOUNTER — Encounter: Payer: Self-pay | Admitting: Hematology and Oncology

## 2023-08-28 ENCOUNTER — Other Ambulatory Visit: Payer: Self-pay | Admitting: *Deleted

## 2023-08-28 DIAGNOSIS — Z170 Estrogen receptor positive status [ER+]: Secondary | ICD-10-CM

## 2023-08-28 NOTE — Progress Notes (Signed)
Per pt request RN successfully faxed referral to Dr. Marita Kansas for Diep flap reconstruction surgery [phone].

## 2023-10-17 ENCOUNTER — Inpatient Hospital Stay: Payer: BC Managed Care – PPO | Attending: Hematology and Oncology | Admitting: Hematology and Oncology

## 2023-10-17 VITALS — BP 139/77 | HR 86 | Temp 97.5°F | Resp 18 | Ht 59.75 in | Wt 168.8 lb

## 2023-10-17 DIAGNOSIS — C50412 Malignant neoplasm of upper-outer quadrant of left female breast: Secondary | ICD-10-CM | POA: Diagnosis not present

## 2023-10-17 DIAGNOSIS — Z170 Estrogen receptor positive status [ER+]: Secondary | ICD-10-CM | POA: Insufficient documentation

## 2023-10-17 DIAGNOSIS — Z79810 Long term (current) use of selective estrogen receptor modulators (SERMs): Secondary | ICD-10-CM | POA: Diagnosis not present

## 2023-10-17 DIAGNOSIS — Z9012 Acquired absence of left breast and nipple: Secondary | ICD-10-CM | POA: Diagnosis not present

## 2023-10-17 NOTE — Assessment & Plan Note (Signed)
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 Hot flashes: Intermittent. 2. Fatigue   Severe osteoporosis: T score -3 She is currently on Fosamax 70 mg weekly along with calcium and vitamin D   Recurrence: DCIS 1.  Mammogram 11/07/22: Left Breast Calcs 2. 11/16/22: Biopsies X 3: IG-HG DCIS ER/PR Positive 3.  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

## 2023-10-17 NOTE — Progress Notes (Signed)
Patient Care Team: Bernadette Hoit, MD as PCP - General (Family Medicine) Richarda Overlie, MD as Consulting Physician (Obstetrics and Gynecology) Serena Croissant, MD as Consulting Physician (Hematology and Oncology) Lonie Peak, MD as Attending Physician (Radiation Oncology) Pershing Proud, RN as Oncology Nurse Navigator Donnelly Angelica, RN as Oncology Nurse Navigator  DIAGNOSIS:  Encounter Diagnosis  Name Primary?   Malignant neoplasm of upper-outer quadrant of left breast in female, estrogen receptor positive (HCC) Yes    SUMMARY OF ONCOLOGIC HISTORY: Oncology History  Breast cancer of upper-outer quadrant of left female breast (HCC)  06/02/2015 Breast US   Left breast: hypoechoic mass at 130, 7 cm from the nipple measuring approximately 1.6 x 0.9 x 1.4 cm. There is a border along the superior margin which appears indistinct.    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 40.05 Gy in 15 fractions. Left Breast Boost. 10 Gy in 5 fractions.  Total dose: 50 Gy   08/30/2015 -  Anti-estrogen oral therapy   Anastrozole 1 mg daily. Switch to tamoxifen 10 mg daily July 2017 due to severe osteoporosis T score -3   09/27/2015 Survivorship   Survivorship care plan completed and mailed to patient in lieu of in person visit   11/16/2022 Relapse/Recurrence   Mammogram detected left breast  calcifications which on biopsy came back as intermediate to high-grade DCIS.  She had 3 biopsies all of which were ER/PR positive.   12/08/2022 Genetic Testing   Negative genetic testing on the [facility] Multi-Cancer +RNA Panel.  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 Report date is 12/08/2022.   The Multi-Cancer + RNA Panel offered by [facility] includes sequencing and/or deletion/duplication analysis of the following 70 genes:  AIP*, ALK, APC*, ATM*, AXIN2*, BAP1*, BARD1*, BLM*, BMPR1A*, BRCA1*, BRCA2*, BRIP1*, CDC73*, CDH1*, CDK4, CDKN1B*, CDKN2A, CHEK2*, CTNNA1*, DICER1*, EPCAM (del/dup only), EGFR, FH*, FLCN*, GREM1 (promoter dup only), HOXB13, KIT, LZTR1, MAX*, MBD4, MEN1*, MET, MITF, MLH1*, MSH2*, MSH3*, MSH6*, MUTYH*, NF1*, NF2*, NTHL1*, PALB2*, PDGFRA, PMS2*, POLD1*, POLE*, POT1*, PRKAR1A*, PTCH1*, PTEN*, RAD51C*, RAD51D*, RB1*, RET, SDHA* (sequencing only), SDHAF2*, SDHB*, SDHC*, SDHD*, SMAD4*, SMARCA4*, SMARCB1*, SMARCE1*, STK11*, SUFU*, TMEM127*, TP53*, TSC1*, TSC2*, VHL*. RNA analysis is performed for * genes.     CHIEF COMPLIANT: Follow-up on tamoxifen  HISTORY OF PRESENT ILLNESS:   History of Present Illness   Norma Robles, a patient with a history of left breast mastectomy in June and renal carcinoma in August, presents for a routine follow-up. Both conditions were treated surgically and no further treatments were required. She acknowledges weight gain over the past year due to decreased activity following her surgeries. She also reports mild high blood pressure. She is currently on tamoxifen for breast cancer prevention, which she tolerates well except for hot flashes. She is due for a bone density test in February, having previously improved from osteoporosis to osteopenia.  ALLERGIES:  is allergic to codeine.  MEDICATIONS:  Current Outpatient Medications  Medication Sig Dispense Refill   amLODipine (NORVASC) 5 MG tablet Take 5 mg by mouth daily.      docusate sodium (COLACE) 100 MG capsule Take 1 capsule (100 mg total) by mouth 2 (two) times daily.     Glycerin-Hypromellose-PEG 400 (DRY EYE RELIEF DROPS) 0.2-0.2-1 % SOLN Place 1 drop into both eyes daily as needed (Dry eye).     tamoxifen (NOLVADEX) 20 MG tablet Take 1 tablet (20 mg total) by mouth daily. 90 tablet 3   No current facility-administered medications for this visit.    PHYSICAL EXAMINATION: ECOG PERFORMANCE STATUS: 1 - Symptomatic but completely ambulatory  Vitals:   10/17/23 0956  BP: 139/77  Pulse: 86  Resp: 18  Temp: (!) 97.5 F (36.4 C)  SpO2: 98%   Filed Weights   10/17/23 0956  Weight: 168 lb 12.8 oz (76.6 kg)    Physical Exam   BREAST: Bulge in left breast, normal. Scar from previous surgery on left breast. Redistribution of tissue with surgery, normal. Scars feel fine. Overall, chest looks and feels normal.      (exam performed in the presence of a chaperone)  LABORATORY DATA:  I have reviewed the data as listed    Latest Ref Rng & Units 06/30/2023    4:46 AM 06/19/2023    8:08 AM 03/29/2023    2:56 PM  CMP  Glucose 70 - 99 mg/dL 161  096  97   BUN 8 - 23 mg/dL 10  17  10    Creatinine 0.44 - 1.00 mg/dL 0.45  4.09  8.11   Sodium 135 - 145 mmol/L 135  138  138   Potassium 3.5 - 5.1 mmol/L 3.6  4.9  3.8   Chloride 98 - 111 mmol/L 104  105  105   CO2 22 - 32 mmol/L 21  24  25    Calcium 8.9 - 10.3 mg/dL 8.0  9.9  9.4     Lab Results  Component Value Date   WBC 6.4 06/19/2023   HGB 11.1 (L) 07/01/2023   HCT 34.6 (L) 07/01/2023   MCV 90.3 06/19/2023   PLT 305 06/19/2023   NEUTROABS 5.1 06/09/2015    ASSESSMENT & PLAN:  Breast cancer of upper-outer quadrant of left female breast (HCC) Left lumpectomy 06/17/2015: Invasive ductal carcinoma, grade 3, 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 Hot flashes: Intermittent. 2. Fatigue   Severe osteoporosis: T score -3 She is currently on Fosamax 70 mg weekly along with calcium and vitamin D   Recurrence: DCIS 1.  Mammogram 11/07/22: Left Breast Calcs 2. 11/16/22: Biopsies X 3: IG-HG DCIS ER/PR Positive 3.  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 ------------------------------------- Assessment and Plan    Breast Cancer Status post left mastectomy in June. Currently on Tamoxifen with side effect of hot flashes. -Continue Tamoxifen. -Refills until February, will renew as needed.  Renal Carcinoma Status post surgical treatment in August. No further treatment required. -No additional plan at this time.  Weight Management Desire for breast reconstruction delayed due to need for weight loss. Discussed potential for weight loss injections. -Consider discussing weight loss injections with primary care provider. -Explore insurance coverage for weight loss injections. -Consider joining weight loss programs such as [employer] or local health clubs offering weight loss programs.  Hypertension Mild hypertension, potentially improved with weight loss. -No additional plan at this time.  Bone Health History of osteoporosis, improved to  osteopenia. Upcoming bone density test in February. -Continue Tamoxifen, which aids in bone density improvement.  Follow-up in one year.          No orders of the defined types were placed in this encounter.  The patient has a good understanding of the overall plan. she agrees with it. she will call with any problems that may develop before the next visit here. Total time spent: 30 mins including face to face time and time spent for planning, charting and co-ordination of care   Tamsen Meek, MD 10/17/23

## 2023-11-14 ENCOUNTER — Other Ambulatory Visit: Payer: Self-pay | Admitting: Obstetrics and Gynecology

## 2023-11-14 DIAGNOSIS — Z1231 Encounter for screening mammogram for malignant neoplasm of breast: Secondary | ICD-10-CM

## 2023-11-15 ENCOUNTER — Other Ambulatory Visit (HOSPITAL_BASED_OUTPATIENT_CLINIC_OR_DEPARTMENT_OTHER): Payer: Self-pay

## 2023-11-15 MED ORDER — WEGOVY 0.25 MG/0.5ML ~~LOC~~ SOAJ
0.2500 mg | SUBCUTANEOUS | 1 refills | Status: DC
  Filled 2023-11-15: qty 2, 28d supply, fill #0

## 2023-11-19 ENCOUNTER — Other Ambulatory Visit (HOSPITAL_BASED_OUTPATIENT_CLINIC_OR_DEPARTMENT_OTHER): Payer: Self-pay

## 2023-11-27 ENCOUNTER — Other Ambulatory Visit: Payer: Self-pay | Admitting: Hematology and Oncology

## 2023-11-27 ENCOUNTER — Ambulatory Visit
Admission: RE | Admit: 2023-11-27 | Discharge: 2023-11-27 | Disposition: A | Discharge status: Home / Self Care | Payer: BC Managed Care – PPO | Source: Ambulatory Visit | Attending: Obstetrics and Gynecology | Admitting: Obstetrics and Gynecology

## 2023-11-27 DIAGNOSIS — Z1231 Encounter for screening mammogram for malignant neoplasm of breast: Secondary | ICD-10-CM

## 2024-10-14 ENCOUNTER — Other Ambulatory Visit: Payer: Self-pay | Admitting: Obstetrics and Gynecology

## 2024-10-14 DIAGNOSIS — Z1231 Encounter for screening mammogram for malignant neoplasm of breast: Secondary | ICD-10-CM

## 2024-10-15 NOTE — Assessment & Plan Note (Signed)
 Left lumpectomy 06/17/2015: Invasive ductal carcinoma, grade 3, tumor size 1.6 cm, margins negative posterior 1 mm, 0/2 sentinel nodes, ER 100%, PR 60%, HER-2 negative ratio 0.67, Ki-67 30%, T1 cN0 stage IA, Oncotype DX score 25, 16% risk of recurrence with tamoxifen  alone, completed adjuvant radiation 08/27/2015   started anastrozole  09/27/2015 switched to tamoxifen  09/19/2015 until 2021   Current treatment: Restarted adjuvant tamoxifen  20 mg daily started January 2024   Tamoxifen  toxicities: 1. Hot flashes: Intermittent. 2. Fatigue   Severe osteoporosis: T score -3 She is currently on Fosamax  70 mg weekly along with calcium and vitamin D    Recurrence: DCIS 1.  Mammogram 128/25: Benign, density B 2. 11/16/22: Biopsies X 3: IG-HG DCIS ER/PR Positive 3.  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

## 2024-10-16 ENCOUNTER — Inpatient Hospital Stay: Payer: BC Managed Care – PPO | Attending: Hematology and Oncology | Admitting: Hematology and Oncology

## 2024-10-16 VITALS — BP 112/60 | HR 82 | Temp 97.6°F | Resp 17 | Ht 59.0 in | Wt 144.6 lb

## 2024-10-16 DIAGNOSIS — Z85528 Personal history of other malignant neoplasm of kidney: Secondary | ICD-10-CM | POA: Insufficient documentation

## 2024-10-16 DIAGNOSIS — Z1732 Human epidermal growth factor receptor 2 negative status: Secondary | ICD-10-CM | POA: Diagnosis not present

## 2024-10-16 DIAGNOSIS — Z1721 Progesterone receptor positive status: Secondary | ICD-10-CM | POA: Diagnosis not present

## 2024-10-16 DIAGNOSIS — C50412 Malignant neoplasm of upper-outer quadrant of left female breast: Secondary | ICD-10-CM | POA: Diagnosis not present

## 2024-10-16 DIAGNOSIS — Z170 Estrogen receptor positive status [ER+]: Secondary | ICD-10-CM | POA: Insufficient documentation

## 2024-10-16 DIAGNOSIS — M810 Age-related osteoporosis without current pathological fracture: Secondary | ICD-10-CM | POA: Diagnosis not present

## 2024-10-16 DIAGNOSIS — Z79810 Long term (current) use of selective estrogen receptor modulators (SERMs): Secondary | ICD-10-CM | POA: Insufficient documentation

## 2024-10-16 DIAGNOSIS — Z9012 Acquired absence of left breast and nipple: Secondary | ICD-10-CM | POA: Diagnosis not present

## 2024-10-16 MED ORDER — TAMOXIFEN CITRATE 20 MG PO TABS: 20.0000 mg | ORAL_TABLET | Freq: Every day | ORAL | 3 refills | Status: AC

## 2024-10-16 NOTE — Progress Notes (Signed)
 Patient Care Team: Aletha Bene, MD as PCP - General (Family Medicine) Johnnye Ade, MD as Consulting Physician (Obstetrics and Gynecology) Odean Potts, MD as Consulting Physician (Hematology and Oncology) Izell Domino, MD as Attending Physician (Radiation Oncology) Tyree Nanetta SAILOR, RN as Oncology Nurse Navigator  DIAGNOSIS:  Encounter Diagnosis  Name Primary?   Malignant neoplasm of upper-outer quadrant of left breast in female, estrogen receptor positive (HCC) Yes    SUMMARY OF ONCOLOGIC HISTORY: Oncology History  Breast cancer of upper-outer quadrant of left female breast (HCC)  06/02/2015 Breast US    Left breast: hypoechoic mass at 130, 7 cm from the nipple measuring approximately 1.6 x 0.9 x 1.4 cm. There is a border along the superior margin which appears indistinct.    06/02/2015 Initial Biopsy   Left breast biopsy 1:30 position: Invasive ductal carcinoma with associated microcalcification, grade 1-2, ER+ (100%), PR+ (60%), HER-2 negative, Ki-67 30%, 1.6 cm tumor    06/03/2015 Clinical Stage   Stage IA: T1c N0   06/17/2015 Definitive Surgery   Left lumpectomy/SLNB ([facility]): Invasive ductal carcinoma, grade 3, tumor size 1.6 cm, HER2/neu repeated and remains negative (ratio 0.67),  margins negative posterior 1 mm, 0/2 sentinel nodes   06/17/2015 Pathologic Stage   Stage IA: T1c N0   06/17/2015 Oncotype testing   Score 25 (16% ROR)   08/02/2015 - 08/27/2015 Radiation Therapy   Adjuvant XRT [facility]): Left Breast. 40.05 Gy in 15 fractions. Left Breast Boost. 10 Gy in 5 fractions.  Total dose: 50 Gy   08/30/2015 -  Anti-estrogen oral therapy   Anastrozole  1 mg daily. Switch to tamoxifen  10 mg daily July 2017 due to severe osteoporosis T score -3   09/27/2015 Survivorship   Survivorship care plan completed and mailed to patient in lieu of in person visit   11/16/2022 Relapse/Recurrence   Mammogram detected left breast calcifications which on biopsy came back as  intermediate to high-grade DCIS.  She had 3 biopsies all of which were ER/PR positive.   12/08/2022 Genetic Testing   Negative genetic testing on the [facility] Multi-Cancer +RNA Panel.  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 Report date is 12/08/2022.   The Multi-Cancer + RNA Panel offered by [facility] includes sequencing and/or deletion/duplication analysis of the following 70 genes:  AIP*, ALK, APC*, ATM*, AXIN2*, BAP1*, BARD1*, BLM*, BMPR1A*, BRCA1*, BRCA2*, BRIP1*, CDC73*, CDH1*, CDK4, CDKN1B*, CDKN2A, CHEK2*, CTNNA1*, DICER1*, EPCAM (del/dup only), EGFR, FH*, FLCN*, GREM1 (promoter dup only), HOXB13, KIT, LZTR1, MAX*, MBD4, MEN1*, MET, MITF, MLH1*, MSH2*, MSH3*, MSH6*, MUTYH*, NF1*, NF2*, NTHL1*, PALB2*, PDGFRA, PMS2*, POLD1*, POLE*, POT1*, PRKAR1A*, PTCH1*, PTEN*, RAD51C*, RAD51D*, RB1*, RET, SDHA* (sequencing only), SDHAF2*, SDHB*, SDHC*, SDHD*, SMAD4*, SMARCA4*, SMARCB1*, SMARCE1*, STK11*, SUFU*, TMEM127*, TP53*, TSC1*, TSC2*, VHL*. RNA analysis is performed for * genes.     CHIEF COMPLIANT:   HISTORY OF PRESENT ILLNESS: Discussed the use of AI scribe software for clinical note transcription with the patient, who gave verbal consent to proceed.  History of Present Illness Norma Robles is a 63 year old female with estrogen receptor-positive left breast cancer and clear-cell renal cell carcinoma who presents for routine oncology follow-up.  She is two years into tamoxifen  therapy for estrogen receptor-positive left breast cancer, with three years remaining. She denies abnormal vaginal bleeding or spotting. Last year, a Pap smear and vaginal ultrasound showed thickened endometrial lining, and endometrial biopsy findings were attributed to tamoxifen .  She has lost about twenty pounds in preparation for possible breast reconstruction and is  concerned about weight regain, particularly if tirzepatide is discontinued, which she currently obtains as an uninsured compounded  medication. She feels better after weight loss and is considering DIEP flap reconstruction but has deferred surgery while weighing risks and benefits given her age and comorbidities.  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 She follows with urology with periodic CT surveillance and reports no new symptoms or complications from renal cell carcinoma.  Oct 17, 2023: Follow-up visit for ongoing tamoxifen  therapy after left mastectomy for recurrent DCIS in June 2024; patient experiencing hot flashes as a side effect. Renal cell carcinoma status post right partial nephrectomy in August 2024, no evidence of recurrence. Osteoporosis improved to osteopenia, bone density test scheduled, and weight management discussed; advised to continue tamoxifen  and follow up in one year.     ALLERGIES:  is allergic to codeine.  MEDICATIONS:  Current Outpatient Medications  Medication Sig Dispense Refill   amLODipine  (NORVASC ) 5 MG tablet Take 5 mg by mouth daily.     docusate sodium  (COLACE) 100 MG capsule Take 1 capsule (100 mg total) by mouth 2 (two) times daily.     Glycerin-Hypromellose-PEG 400 (DRY EYE RELIEF DROPS) 0.2-0.2-1 % SOLN Place 1 drop into both eyes daily as needed (Dry eye).     tamoxifen  (NOLVADEX ) 20 MG tablet Take 1 tablet by mouth once daily 90 tablet 3   No current facility-administered medications for this visit.    PHYSICAL EXAMINATION: ECOG PERFORMANCE STATUS: 1 - Symptomatic but completely ambulatory  Vitals:   10/16/24 1035  BP: 112/60  Pulse: 82  Resp: 17  Temp: 97.6 F (36.4 C)  SpO2: 100%   Filed Weights   10/16/24 1035  Weight: 144 lb 9.6 oz (65.6 kg)    Physical Exam   (exam performed in the presence of a chaperone)  LABORATORY DATA:  I have reviewed the data as listed    Latest Ref Rng & Units 06/30/2023    4:46 AM 06/19/2023    8:08 AM 03/29/2023    2:56 PM  CMP  Glucose 70 - 99 mg/dL 879  892  97   BUN 8  - 23 mg/dL 10  17  10    Creatinine 0.44 - 1.00 mg/dL 9.18  9.28  9.26   Sodium 135 - 145 mmol/L 135  138  138   Potassium 3.5 - 5.1 mmol/L 3.6  4.9  3.8   Chloride 98 - 111 mmol/L 104  105  105   CO2 22 - 32 mmol/L 21  24  25    Calcium 8.9 - 10.3 mg/dL 8.0  9.9  9.4     Lab Results  Component Value Date   WBC 6.4 06/19/2023   HGB 11.1 (L) 07/01/2023   HCT 34.6 (L) 07/01/2023   MCV 90.3 06/19/2023   PLT 305 06/19/2023   NEUTROABS 5.1 06/09/2015    ASSESSMENT & PLAN:  Breast cancer of upper-outer quadrant of left female breast (HCC) Left lumpectomy 06/17/2015: Invasive ductal carcinoma, grade 3, tumor size 1.6 cm, margins negative posterior 1 mm, 0/2 sentinel nodes, ER 100%, PR 60%, HER-2 negative ratio 0.67, Ki-67 30%, T1 cN0 stage IA, Oncotype DX score 25, 16% risk of recurrence with tamoxifen  alone, completed adjuvant radiation 08/27/2015   started anastrozole  09/27/2015 switched to tamoxifen  09/19/2015 until 2021 11/16/2022: DCIS ER/PR positive   Current treatment: Restarted adjuvant tamoxifen  20 mg daily started January 2024   Tamoxifen  toxicities: 1. Hot flashes: Intermittent. 2.  Fatigue   Severe osteoporosis: T score -3 She is currently on Fosamax  70 mg weekly along with calcium and vitamin D    Recurrence: DCIS 1.  Mammogram 128/25: Benign, density B 2. 11/16/22: Biopsies X 3: IG-HG DCIS ER/PR Positive 3.  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  However she is very afraid of the DIEP flap surgery and is having second  thoughts and reconstruction.    No orders of the defined types were placed in this encounter.  The patient has a good understanding of the overall plan. she agrees with it. she will call with any problems that may develop before the next visit here.  I personally spent a total of 30 minutes in the care of the patient today including preparing to see the patient, getting/reviewing separately obtained history, performing a medically appropriate exam/evaluation, counseling and educating, placing orders, referring and communicating with other health care professionals, documenting clinical information in the EHR, independently interpreting results, communicating results, and coordinating care.   Viinay K Shaneisha Burkel, MD 10/16/2024

## 2024-10-20 NOTE — Progress Notes (Signed)
 [facility] Return Patient Visit Norma Robles DOB: October 16, 1961  AGE: 63 y.o. SEX:  female MRN: [id number] Visit Date: 10/20/2024  Encounter Provider: Friddie Norman Ahr, PA-C  Chief Complaint  Patient presents with   Annual Exam    Patient is fasting. Mammogram apt in June.     SUBJECTIVE:  History of Present Illness [name] is a 63 y.o. female who presents today for follow up visit and annual physical.   Patient was started on lipitor 20 mg in August for hyperlipidemia. She presents today for labs. She has had no side effects or concerns with the medication.   She continues to take amlodipine  5 mg for HTN without concerns. She is due for refill.   Preventative aspects of care have been reviewed with the patient. - Nutrition: poor appetite with zepbound, will be coming off soon  - Exercise: enjoys walking, has walking pad and weights at home - Sleep: 7-8 hrs/ night - Tobacco use: none - Alcohol use: none - Dental health: has dental home, sees twice annually - Depression (PHQ2 screening):  Behavioral Health Screening  Patient Health Questionnaire-2 Score: 0 (10/20/2024  8:50 AM)      Patient's Depression screening is Negative   Depression Plan: Normal/Negative Screening  - Pap smear (21-64): UTD - Mammogram (40-74): UTD, next in January - Colonoscopy (45-75): UTD, next in 2028  - Tdap vaccine: UTD, next in 2032 - Pneumonia vaccine (50+): due - Shingles vaccine (50+): due - RSV vaccine (60+): due - Annual flu vaccine: due  Problem List[1] Medical History[2] Surgical History[3] Family History[4] Social History   Socioeconomic History   Marital status: Divorced    Spouse name: Not on file   Number of children: Not on file   Years of education: Not on file   Highest education level: Not on file  Occupational History   Not on file  Tobacco Use   Smoking status: Former    Passive exposure: Past   Smokeless  tobacco: Never   Tobacco comments:    Quit 20 years ago  Vaping Use   Vaping status: Never Used  Substance and Sexual Activity   Alcohol use: No   Drug use: No   Sexual activity: Not on file  Other Topics Concern   Not on file  Social History Narrative   Pt stays active. Pt is divorced. Pt trying to make lifestyle changes. Pt has 5 grandchildren   Social Drivers of Health   Living Situation: Low Risk (10/20/2024)   Living Situation    What is your living situation today?: I have a steady place to live    Think about the place you live. Do you have problems with any of the following? Choose all that apply:: None/None on this list  Food Insecurity: Low Risk (10/20/2024)   Food vital sign    Within the past 12 months, you worried that your food would run out before you got money to buy more: Never true    Within the past 12 months, the food you bought just didn't last and you didn't have money to get more: Never true  Transportation Needs: No Transportation Needs (10/20/2024)   Transportation    In the past 12 months, has lack of reliable transportation kept you from medical appointments, meetings, work or from getting things needed for daily living? : No  Utilities: Low Risk (10/20/2024)   Utilities    In the past 12 months has the electric, gas,  oil, or water  company threatened to shut off services in your home? : No  Safety: Low Risk (10/20/2024)   Safety    How often does anyone, including family and friends, physically hurt you?: Never    How often does anyone, including family and friends, insult or talk down to you?: Never    How often does anyone, including family and friends, threaten you with harm?: Never    How often does anyone, including family and friends, scream or curse at you?: Never  Alcohol Screening: Not At Risk (10/20/2024)   Alcohol    Audit C Alcohol risk score: 0  Tobacco Use: Medium Risk (10/20/2024)   Patient History    Smoking Tobacco  Use: Former    Smokeless Tobacco Use: Never    Passive Exposure: Past  Depression: Not At Risk (10/20/2024)   PHQ-2    PHQ-2 Score: 0  Social Connections: Unknown (03/14/2022)   Received from [facility]   Social Network    Social Network: Not on file  Financial Resource Strain: Not on file   Allergies[5]  OBJECTIVE:   Vitals:   10/20/24 0844 10/20/24 0858  BP: (!) 138/90 120/80  BP Location: Right arm Right arm  Patient Position: Sitting Sitting  Pulse: 82   Temp: 96.2 F (35.7 C)   TempSrc: Temporal   SpO2: 99%   Weight: 63.8 kg (140 lb 9.6 oz)   Height: 1.499 m (4' 11)     Physical Exam Vitals and nursing note reviewed.  Constitutional:      Appearance: Normal appearance.  HENT:     Head: Normocephalic.  Eyes:     Conjunctiva/sclera: Conjunctivae normal.  Cardiovascular:     Rate and Rhythm: Normal rate and regular rhythm.  Pulmonary:     Effort: Pulmonary effort is normal.     Breath sounds: Normal breath sounds.  Skin:    General: Skin is warm and dry.  Neurological:     Mental Status: She is alert.     Gait: Gait normal.  Psychiatric:        Mood and Affect: Mood normal.         Assessment & Plan Mixed hyperlipidemia - Recently started lipitor 20 mg - Continue lifestyle modifications  - Will check labs and make adjustments as needed to medication regimen - Follow up at next physical    Essential hypertension - BP at goal today - Continue lifestyle modifications and current medication regimen, refills provided - Follow up at next physical Orders:   amLODIPine  (NORVASC ) 5 mg tablet; Take 1 tablet (5 mg total) by mouth daily.  Encounter for annual physical exam - Vitals and physical exam normal today - All complaints addressed - Encouraged heart healthy diet, regular movement including 150 minutes of moderate exercise weekly, and good sleep hygiene (7-8 hours sleep nightly) - Encouraged avoiding smoking, alcohol, and drug use -  Depression screening (PHQ2): Negative  - Pap smear (21-64): UTD - Mammogram (40-74): UTD, next in January - Colonoscopy (45-75): UTD, next in 2028  - Tdap vaccine: UTD, next in 2032 - Pneumonia vaccine (50+): defer today, will consider - Shingles vaccine (50+): defer today, will consider - RSV vaccine (60+): due, will consider - Annual flu vaccine: due, declined  - Comprehensive labs ordered - Plan to follow up next year at annual physical, patient will reach out with any concerns in the interim Orders:   CBC without Differential   Comprehensive Metabolic Panel   Lipid Panel  Vitamin D , 25-Hydroxy    Discussed any prescription(s) noted above, including potential side effects, risks/benefits, drug interactions, instructions for taking the medication(s), and the consequences of not taking as recommended. Patient verbalized an understanding of this information and instructions.  They declined any further questions.  The patient understands to report or return to the clinic for any side effects or concerns, as needed.  All questions have been answered to patient's satisfaction. After visit instructions were provided at the end of the encounter to enforce goals and plan of care.  Return in about 1 year (around 10/20/2025) for Annual physical, refill HTN/HLD..  Otherwise, the patient will report or return to clinic for any new or worsening symptoms, as needed.  The patient agrees with the above plan, and verbalizes understanding.  Electronically signed: Friddie Gee Roemhildt, PA-C 10/20/2024  9:17 AM  Note: Portions of this documentation may have been made with the assistance of dictation software and thus could contain potential errors. Effort was made to review and correct errors.       [1] Patient Active Problem List Diagnosis   Breast cancer of upper-outer quadrant of left female breast    (CMD)   Hyperlipemia   Osteoporosis   Vitamin D  insufficiency   Multinodular  thyroid   Essential hypertension   Ductal carcinoma in situ (DCIS) of left breast   History of therapeutic radiation   Clear cell carcinoma of right kidney    (CMD)  [2] Past Medical History: Diagnosis Date   Achilles tendonitis, bilateral 02/10/2015   Acquired equinus deformity of foot 02/10/2015   Anxiety 11/20/2022   Benign ovarian cyst 06/06/2024   Breast cancer    (CMD) 2016   Breast cancer of upper-outer quadrant of left female breast    (CMD) 06/04/2015   Last Assessment & Plan:  Left lumpectomy 06/17/2015: Invasive ductal carcinoma, grade 3, tumor size 1.6 cm, margins negative posterior 1 mm, 0/2 sentinel nodes, ER 100%, PR 60%, HER-2 negative ratio 0.67, Ki-67 30%, T1 cN0 stage IA, Oncotype DX score 25, 16% risk of recurrence with tamoxifen  alone, completed adjuvant radiation 08/27/2015  started anastrozole  09/27/2015.  Recommendations: Adjuvant    Clear cell carcinoma of right kidney    (CMD) 06/06/2024   partial nephrectomy     Closed fracture of multiple ribs of left side with routine healing 03/22/2016   Depression 05/29/2014   Ductal carcinoma in situ (DCIS) of left breast 11/24/2022   Essential hypertension 12/17/2019   History of therapeutic radiation 11/24/2022   Hyperlipemia 08/06/2014   Leiomyoma of uterus 08/06/2014   Obesity   [3] Past Surgical History: Procedure Laterality Date   BREAST SURGERY Left 10/13/2015   Procedure: BREAST SURGERY; breast cancer (lumpectomy)   CHOLECYSTECTOMY     Procedure: CHOLECYSTECTOMY   LYMPH NODE BIOPSY     TONSILLECTOMY     Procedure: TONSILLECTOMY   TONSILLECTOMY  1986   UTERINE FIBROID EMBOLIZATION     Procedure: UTERINE FIBROID EMBOLIZATION  [4] Family History Problem Relation Name Age of Onset   Heart attack Mother Beckey Collier        died age 28   Graves' disease Mother Beckey Collier    Atrial fibrillation Mother Beckey Collier    Uterine cancer Mother Beckey Collier    Cancer Mother Beckey Collier     Diabetes Mother Beckey Collier    Diabetes type II Father Marzetta JINNY Collier        died 25   Heart failure Father Marzetta JINNY  Leviner    Hypertension Father Marzetta JINNY Collier    Thyroid disease Sister     Hashimoto's thyroiditis Sister     Hypothyroidism Sister Holland Dustman    Diabetes Sister Sheron Stone    Lymphoma Sister Ginnie Collier        NonHodgkin's Lymphoma   Heart failure Sister Ginnie Collier        due chemotherapy   Diabetes Sister Ginnie Collier    Hypertension Brother Abran Collier    Hypothyroidism Brother Abran Collier    Prostate cancer Brother Abran Collier    Diabetes Brother Abran Collier    Left Breast Cancer Daughter 36    Cancer Daughter Ginger Moats    Breast cancer Maternal Aunt     Breast cancer Paternal Aunt     Cancer Maternal Aunt Winton Like    Cancer Brother Daril Collier    Cancer Sister Tye Holmes    Colon cancer Neg Hx    [5] Allergies Allergen Reactions   Codeine GI Intolerance and Nausea Only    Other reaction(s): Nausea Only, Other reaction(s): Nausea Only  Other reaction(s): Nausea Only Other reaction(s): Nausea Only  Other reaction(s): Nausea Only  Other reaction(s): Nausea Only

## 2024-11-27 ENCOUNTER — Ambulatory Visit
Admission: RE | Admit: 2024-11-27 | Discharge: 2024-11-27 | Disposition: A | Discharge status: Home / Self Care | Source: Ambulatory Visit | Attending: Obstetrics and Gynecology | Admitting: Obstetrics and Gynecology

## 2024-11-27 DIAGNOSIS — Z1231 Encounter for screening mammogram for malignant neoplasm of breast: Secondary | ICD-10-CM
